# Patient Record
Sex: Female | Born: 1970 | Race: White | Hispanic: No | Marital: Married | Smoking: Current every day smoker
Health system: Southern US, Community
[De-identification: ages and names within clinical notes are randomized; demographics above are authoritative.]

## PROBLEM LIST (undated history)

## (undated) DIAGNOSIS — K760 Fatty (change of) liver, not elsewhere classified: Principal | ICD-10-CM

## (undated) DIAGNOSIS — T7840XA Allergy, unspecified, initial encounter: Secondary | ICD-10-CM

## (undated) DIAGNOSIS — Z720 Tobacco use: Secondary | ICD-10-CM

## (undated) DIAGNOSIS — E785 Hyperlipidemia, unspecified: Secondary | ICD-10-CM

## (undated) DIAGNOSIS — F1910 Other psychoactive substance abuse, uncomplicated: Secondary | ICD-10-CM

## (undated) DIAGNOSIS — R740 Nonspecific elevation of levels of transaminase and lactic acid dehydrogenase [LDH]: Secondary | ICD-10-CM

## (undated) DIAGNOSIS — R7401 Elevation of levels of liver transaminase levels: Secondary | ICD-10-CM

## (undated) DIAGNOSIS — G43909 Migraine, unspecified, not intractable, without status migrainosus: Secondary | ICD-10-CM

## (undated) DIAGNOSIS — K859 Acute pancreatitis without necrosis or infection, unspecified: Secondary | ICD-10-CM

## (undated) HISTORY — PX: CHOLECYSTECTOMY: SHX55

## (undated) HISTORY — PX: BACK SURGERY: SHX140

## (undated) HISTORY — DX: Fatty (change of) liver, not elsewhere classified: K76.0

## (undated) HISTORY — DX: Elevation of levels of liver transaminase levels: R74.01

## (undated) HISTORY — DX: Hypomagnesemia: E83.42

## (undated) HISTORY — DX: Hyperlipidemia, unspecified: E78.5

## (undated) HISTORY — DX: Acute pancreatitis without necrosis or infection, unspecified: K85.90

## (undated) HISTORY — DX: Migraine, unspecified, not intractable, without status migrainosus: G43.909

## (undated) HISTORY — DX: Allergy, unspecified, initial encounter: T78.40XA

## (undated) HISTORY — PX: TONSILLECTOMY: SUR1361

## (undated) HISTORY — DX: Tobacco use: Z72.0

## (undated) HISTORY — PX: ABDOMINAL HYSTERECTOMY: SHX81

## (undated) HISTORY — DX: Nonspecific elevation of levels of transaminase and lactic acid dehydrogenase (ldh): R74.0

## (undated) HISTORY — DX: Other psychoactive substance abuse, uncomplicated: F19.10

---

## 2006-04-20 LAB — CONVERTED CEMR LAB

## 2006-11-06 ENCOUNTER — Ambulatory Visit: Payer: Self-pay | Admitting: Family Medicine

## 2006-11-26 ENCOUNTER — Encounter: Payer: Self-pay | Admitting: Family Medicine

## 2007-01-07 ENCOUNTER — Encounter: Payer: Self-pay | Admitting: Family Medicine

## 2007-03-24 ENCOUNTER — Ambulatory Visit: Payer: Self-pay | Admitting: Family Medicine

## 2008-02-25 ENCOUNTER — Ambulatory Visit: Payer: Self-pay | Admitting: Family Medicine

## 2010-06-16 IMAGING — CR DG FOOT COMPLETE 3+V*R*
3 series · 3 of 3 positions shown · non-contrast
Comparison: None

CLINICAL DATA: Dropped object on foot with pain

RIGHT FOOT COMPLETE - 3+ VIEW

[view not recorded (1 of 3)]
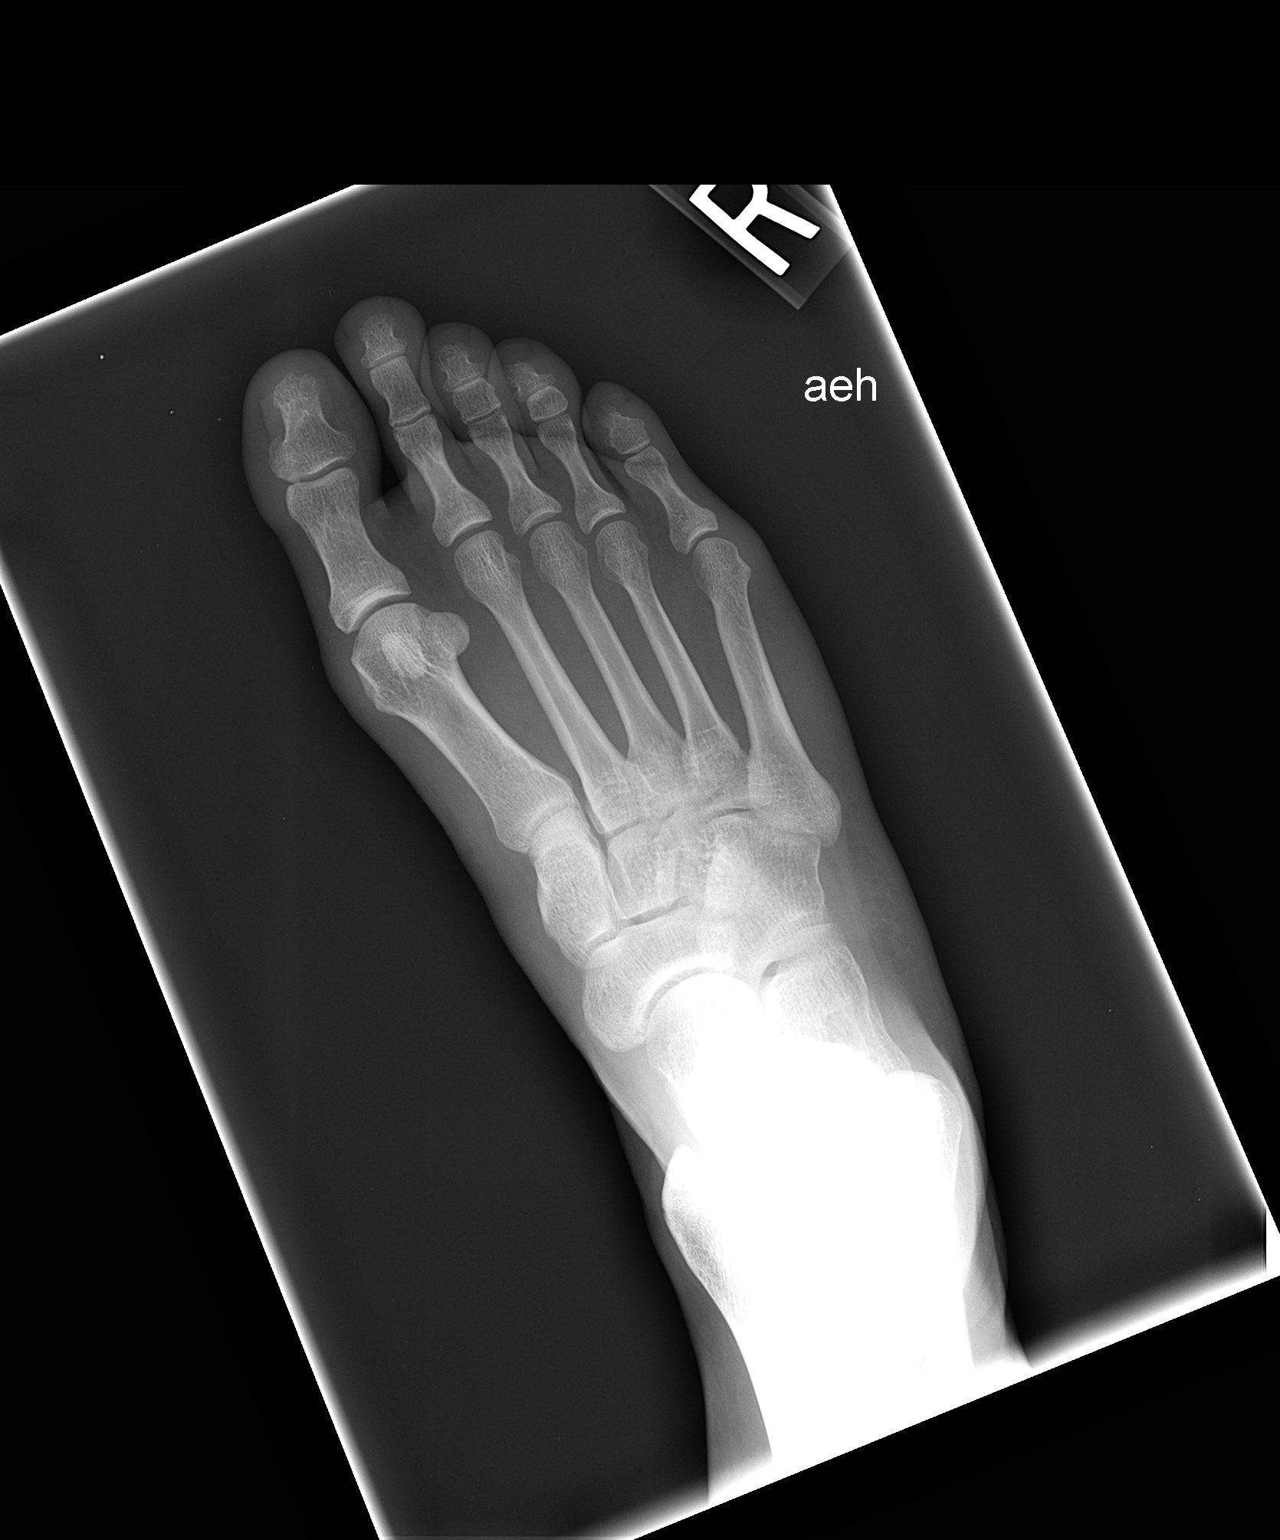

[view not recorded (2 of 3)]
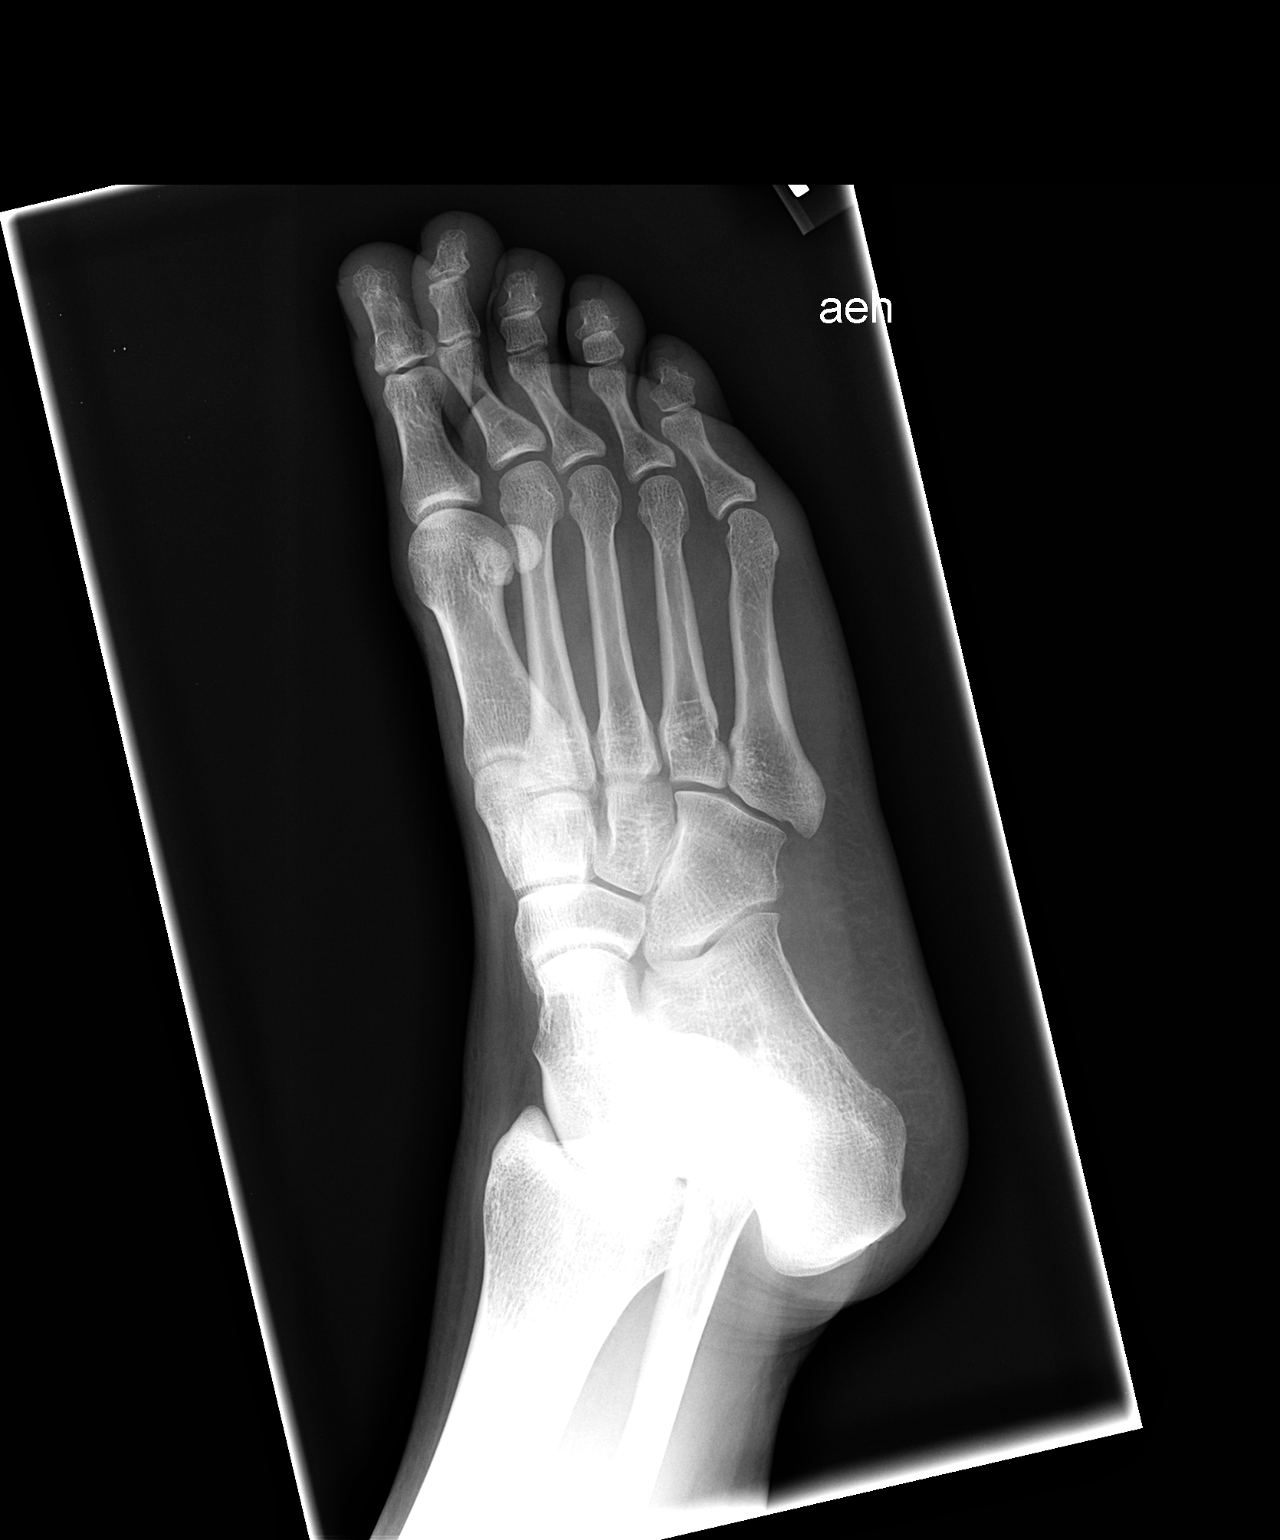

[view not recorded (3 of 3)]
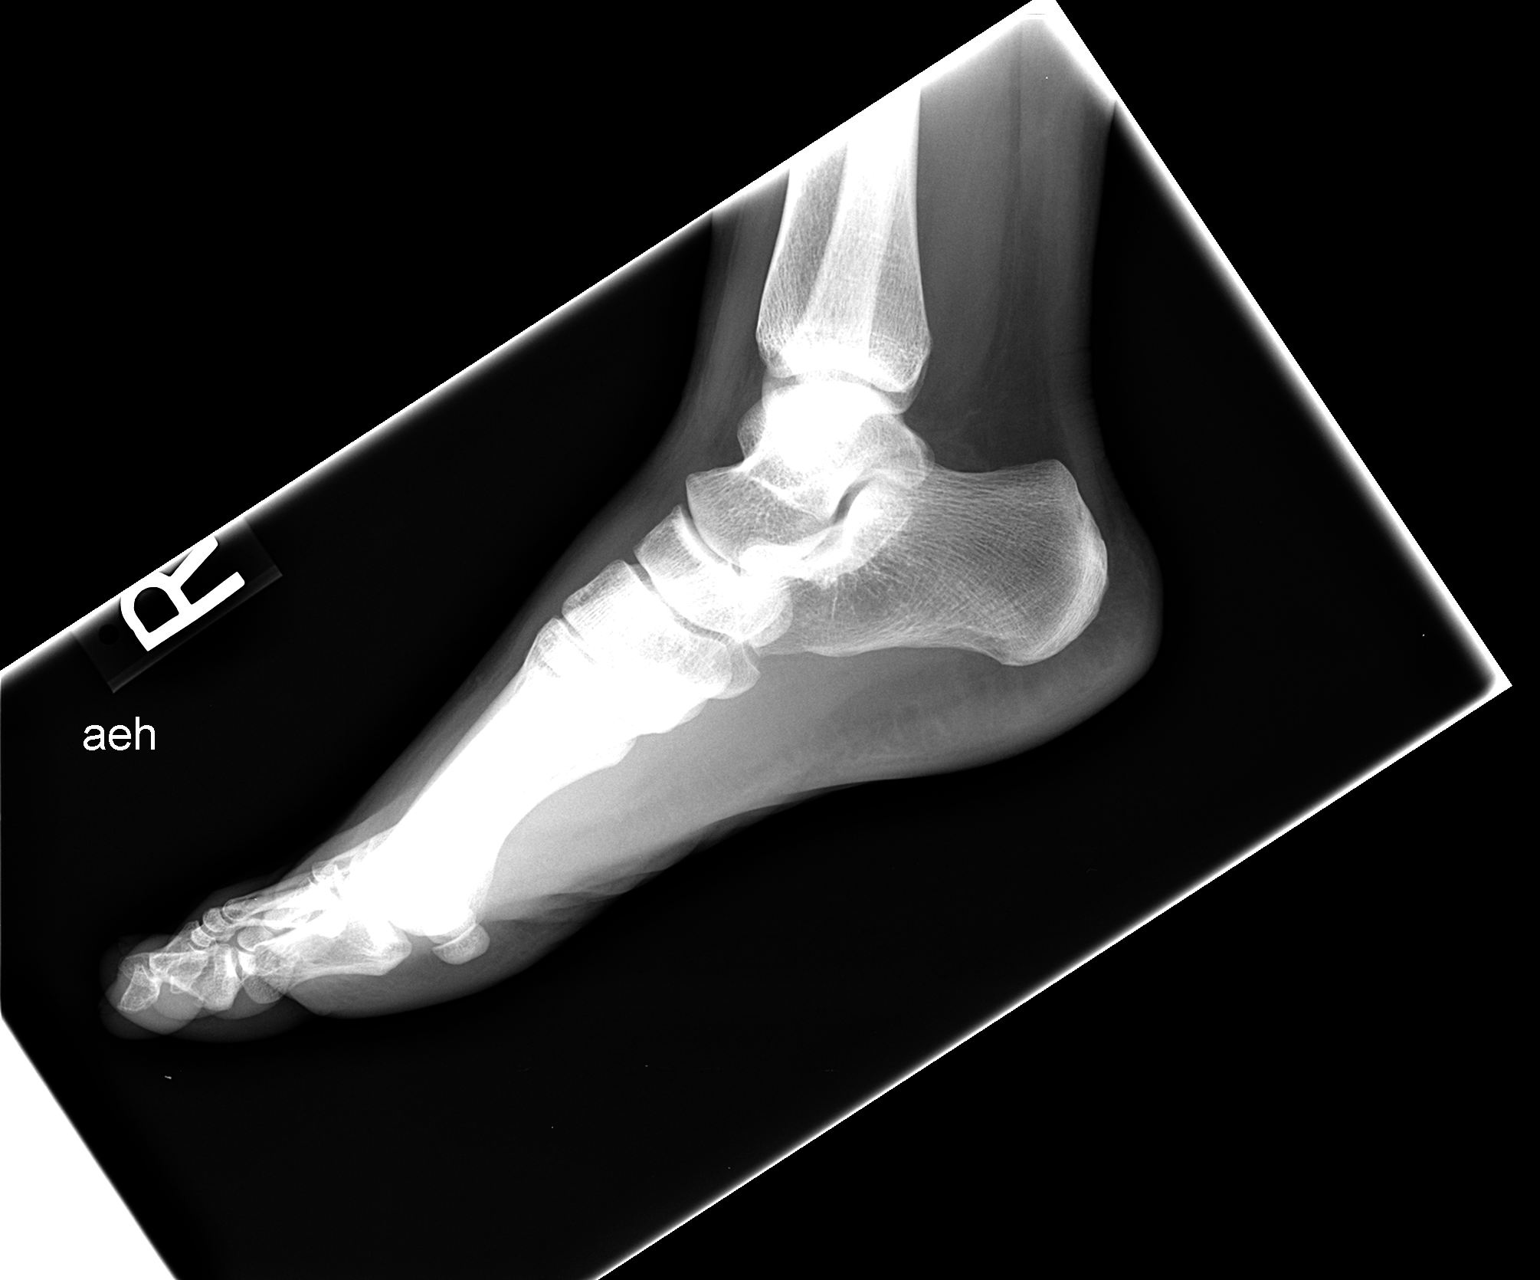

[3 of 3 positions shown; findings below may reference images not displayed]

FINDINGS: No acute fracture is seen.  Tarsal - metatarsal alignment
is normal.  No significant soft tissue swelling is noted.
IMPRESSION: No acute bony abnormality.

## 2013-11-29 LAB — HEPATIC FUNCTION PANEL
ALT: 45 U/L — AB (ref 7–35)
AST: 69 U/L — AB (ref 13–35)

## 2013-11-30 LAB — LIPID PANEL
CHOLESTEROL: 236 mg/dL — AB (ref 0–200)
HDL: 57 mg/dL (ref 35–70)
LDL CALC: 132 mg/dL
Triglycerides: 234 mg/dL — AB (ref 40–160)

## 2013-12-01 LAB — CBC AND DIFFERENTIAL: WBC: 8.1 10^3/mL

## 2013-12-02 LAB — HEPATIC FUNCTION PANEL
ALK PHOS: 91 U/L (ref 25–125)
ALT: 36 U/L — AB (ref 7–35)
AST: 88 U/L — AB (ref 13–35)
Bilirubin, Total: 1.1 mg/dL

## 2013-12-02 LAB — BASIC METABOLIC PANEL
CREATININE: 0.3 mg/dL — AB (ref 0.5–1.1)
POTASSIUM: 3.2 mmol/L — AB (ref 3.4–5.3)

## 2013-12-02 LAB — CBC AND DIFFERENTIAL: WBC: 9.7 10^3/mL

## 2013-12-07 ENCOUNTER — Telehealth: Payer: Self-pay

## 2013-12-07 DIAGNOSIS — K859 Acute pancreatitis, unspecified: Secondary | ICD-10-CM

## 2013-12-07 DIAGNOSIS — E860 Dehydration: Secondary | ICD-10-CM

## 2013-12-07 LAB — LIPASE: LIPASE: 221 U/L — AB (ref 0–75)

## 2013-12-07 NOTE — Telephone Encounter (Signed)
Stacy, Can you please see what hospital she was seen at and whether or not we can get records from her stay prior to her appt tomorrow?

## 2013-12-07 NOTE — Telephone Encounter (Signed)
Rocky LinkKen called this am and stated that Dana Allen was in the hospital in [place] last week and was Dx with dehydration and acute pancreatitis. He also stated that the doctor there stated that Gertude needed to be seen Today or Tuesday of this week or they were not going to release her to go home. The pt. Is eating small amounts, drinking well. As i was on the phone with the husband the pt started to vomit. The husband stated that this was the first time she has vomited sine was in the hospital. Pt is going downstairs to lab to have a CMP and Lipase done today per and I ok it with Dr. Ivan AnchorsHommel for the pt to come in for the labs and to see him tomorrow./Regnald Bowens,CMA

## 2013-12-07 NOTE — Telephone Encounter (Signed)
The husband Rocky LinkKen has the records and labs that where done one a disk and he stated that he was going to print them off and drop them off by the office today./Reata Petrov,CMA

## 2013-12-08 ENCOUNTER — Telehealth: Payer: Self-pay

## 2013-12-08 ENCOUNTER — Ambulatory Visit (INDEPENDENT_AMBULATORY_CARE_PROVIDER_SITE_OTHER): Payer: BC Managed Care – PPO | Admitting: Family Medicine

## 2013-12-08 ENCOUNTER — Encounter: Payer: Self-pay | Admitting: Family Medicine

## 2013-12-08 VITALS — BP 130/84 | HR 72 | Temp 98.5°F | Wt 160.0 lb

## 2013-12-08 DIAGNOSIS — K838 Other specified diseases of biliary tract: Secondary | ICD-10-CM

## 2013-12-08 DIAGNOSIS — E876 Hypokalemia: Secondary | ICD-10-CM

## 2013-12-08 DIAGNOSIS — E785 Hyperlipidemia, unspecified: Secondary | ICD-10-CM | POA: Diagnosis not present

## 2013-12-08 DIAGNOSIS — K7689 Other specified diseases of liver: Secondary | ICD-10-CM

## 2013-12-08 DIAGNOSIS — K859 Acute pancreatitis without necrosis or infection, unspecified: Secondary | ICD-10-CM | POA: Diagnosis not present

## 2013-12-08 DIAGNOSIS — B3731 Acute candidiasis of vulva and vagina: Secondary | ICD-10-CM

## 2013-12-08 DIAGNOSIS — Z8719 Personal history of other diseases of the digestive system: Secondary | ICD-10-CM | POA: Insufficient documentation

## 2013-12-08 DIAGNOSIS — K760 Fatty (change of) liver, not elsewhere classified: Secondary | ICD-10-CM

## 2013-12-08 DIAGNOSIS — K852 Alcohol induced acute pancreatitis without necrosis or infection: Secondary | ICD-10-CM

## 2013-12-08 DIAGNOSIS — B373 Candidiasis of vulva and vagina: Secondary | ICD-10-CM

## 2013-12-08 DIAGNOSIS — R1011 Right upper quadrant pain: Secondary | ICD-10-CM

## 2013-12-08 HISTORY — DX: Hyperlipidemia, unspecified: E78.5

## 2013-12-08 HISTORY — DX: Fatty (change of) liver, not elsewhere classified: K76.0

## 2013-12-08 MED ORDER — OXYCODONE HCL 5 MG PO TABS: 5.0000 mg | ORAL_TABLET | Freq: Four times a day (QID) | ORAL | Status: DC | PRN

## 2013-12-08 MED ORDER — FLUCONAZOLE 150 MG PO TABS: ORAL_TABLET | ORAL | Status: AC

## 2013-12-08 MED ORDER — ONDANSETRON 8 MG PO TBDP: 8.0000 mg | ORAL_TABLET | Freq: Three times a day (TID) | ORAL | Status: DC | PRN

## 2013-12-08 NOTE — Telephone Encounter (Signed)
Dana Allen called and stated that she forgot to tell you when she was in today for her appointment that she has a yeast infection from all the antibiotics from here she was in the hospital and would like to see if she can get an rx for Diflucan sent into [facility]./Alamin Mccuiston,CMA

## 2013-12-08 NOTE — Telephone Encounter (Signed)
Pt informed./Dawt Reeb,CMA 

## 2013-12-08 NOTE — Telephone Encounter (Signed)
Rx has been sent to [facility]. 

## 2013-12-08 NOTE — Progress Notes (Signed)
CC: Dana Allen is a 43 y.o. female is here for Pancreatitis   Subjective: HPI:  Pleasant 43 year old here to establish care  Patient presents for hospital followup 48 admission from July 12 through the 15th [facility] in [place]. She was seen for pancreatitis. She tells me that for the week prior to her admission she was having mild to moderate abdominal pain epigastric region that was nonradiating along with nausea that was progressively getting worse on daily basis. Symptoms became severe in severity on the 12th she was seen in emergency room and diagnosed with pancreatitis with a lipase over 5000. Labs were also significant for a potassium of of 4.2 initially however this declined to 2.9 on her second day of admission and after oral and IV replacement returned to 3.2 on day of discharge. Magnesium was also mildly low during her admission and I believe she had IV supplementation of magnesium. Imaging during her stay was significant for a CT scan that was suggestive of biliary sludge, no evidence of gallstones, and a heterogeneous liver echotexture with areas of increased echogenicity that was concerning for malignancy. Prior to departure she had a liver biopsy which showed severe steatosis and mild fibrosis. Triglycerides were 234, calcium was never elevated, she admits that she was heavily drinking just prior and as symptoms were initially present as she was on vacation.  Since her admission she has not consumed any alcohol nor has she had any trouble abstaining from alcohol.  Since returning home last week she states she feels remarkably better but continues to have moderate nausea after certain foods and persistent 2/10 epigastric pain. She was advancing her diet over the weekend and began to eat spaghetti and casseroles, the above symptoms started to get worse the day after she ate these foods and yesterday she threw up twice however today has had no difficulty keeping  fluids down or foods down. Symptoms are persistent throughout the day, nothing particularly makes them better or worse other than food.  Her only other complaint today is a headache that comes and goes without warning lasting minutes described as constant and like an "axe" straight through her head. She denies any recent or remote motor or sensory disturbances during before or following the headaches   Review of Systems - General ROS: negative for - chills, fever, night sweats, weight gain or weight loss Ophthalmic ROS: negative for - decreased vision Psychological ROS: negative for - anxiety or depression ENT ROS: negative for - hearing change, nasal congestion, tinnitus or allergies Hematological and Lymphatic ROS: negative for - bleeding problems, bruising or swollen lymph nodes Breast ROS: negative Respiratory ROS: no cough, shortness of breath, or wheezing Cardiovascular ROS: no chest pain or dyspnea on exertion Gastrointestinal ROS: no change in bowel habits, or black or bloody stools Genito-Urinary ROS: negative for - genital discharge, genital ulcers, incontinence or abnormal bleeding from genitals Musculoskeletal ROS: negative for - joint pain or muscle pain Neurological ROS: negative for -  memory loss Dermatological ROS: negative for lumps, mole changes, rash and skin lesion changes  Past Medical History  Diagnosis Date  . 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 Hyperlipidemia 12/08/2013    No past surgical history on file. No family history on file.  History   Social History  . Marital Status: Married    Spouse Name: N/A    Number of Children: N/A  .  Years of Education: N/A   Occupational History  . Not on file.   Social History Main Topics  . Smoking status: Not on file  . Smokeless tobacco: Not on file  . Alcohol Use: Not on file  . Drug Use: Not on file  . Sexual Activity: Not on file   Other Topics  Concern  . Not on file   Social History Narrative  . No narrative on file     Objective: BP 130/84  Pulse 72  Temp(Src) 98.5 F (36.9 C) (Oral)  Wt 160 lb (72.576 kg)  General: Alert and Oriented, No Acute Distress HEENT: Pupils equal, round, reactive to light. Conjunctivae clear.  Moist membranes pharynx unremarkable Cranial nerves II through XII grossly intact Lungs: Clear to auscultation bilaterally, no wheezing/ronchi/rales.  Comfortable work of breathing. Good air movement. Cardiac: Regular rate and rhythm. Normal S1/S2.  No murmurs, rubs, nor gallops.   Abdomen: Soft with slight epigastric discomfort., No guarding or rebound Extremities: No peripheral edema.  Strong peripheral pulses.  Mental Status: No depression, anxiety, nor agitation. Skin: Warm and dry.  Assessment & Plan: Lorretta was seen today for pancreatitis.  Diagnoses and associated orders for this visit:  Hepatic steatosis - COMPLETE METABOLIC PANEL WITH GFR  Alcohol-induced acute pancreatitis - COMPLETE METABOLIC PANEL WITH GFR - Ambulatory referral to General Surgery - oxyCODONE (OXY IR/ROXICODONE) 5 MG immediate release tablet; Take 1 tablet (5 mg total) by mouth every 6 (six) hours as needed for moderate pain.  Hypokalemia - COMPLETE METABOLIC PANEL WITH GFR  Hyperlipidemia  Hypomagnesemia - Magnesium  RUQ pain - Ambulatory referral to General Surgery  Biliary sludge - Ambulatory referral to General Surgery  Other Orders - ondansetron (ZOFRAN ODT) 8 MG disintegrating tablet; Take 1 tablet (8 mg total) by mouth every 8 (eight) hours as needed for nausea or vomiting.    Pancreatitis: I believe her symptoms worsen over the weekend due to her advancing her diet a little too soon. I encouraged her to stick to a liquid diet for the rest of the work week and then slowly add bananas, rice, applesauce, toast.  I encouraged her to also schedule the dosing of her Zofran and used to dissolve in  formulation if needed. 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 Overall it sounds like she is improving. Hepatic steatosis: Discussed that this can lead to liver failure, I strongly encouraged her to completely abstain from alcohol for the rest of her life and she expresses agreement and understanding. Hypokalemia: Rechecking potassium Hypomagnesemia: Checking magnesium to see if this is contributing to her headaches Biliary sludge in the setting of pancreatitis: I discussed with her that although I think that her pancreatitis episode was mostly due to alcohol consumption there is a chance that biliary sludge could have contributed to this as well, because of this we will refer her to a general surgeon to discuss if there is any benefit to having her gallbladder removed.  45 minutes spent face-to-face during visit today of which at least 50% was counseling or coordinating care regarding: 1. Hepatic steatosis   2. Alcohol-induced acute pancreatitis   3. Hypokalemia   4. Hyperlipidemia   5. Hypomagnesemia   6. RUQ pain   7. Biliary sludge        Return in about 3 months (around 03/10/2014).

## 2013-12-09 LAB — COMPLETE METABOLIC PANEL WITH GFR
ALT: 15 U/L (ref 0–35)
AST: 21 U/L (ref 0–37)
Albumin: 3.5 g/dL (ref 3.5–5.2)
Alkaline Phosphatase: 74 U/L (ref 39–117)
BILIRUBIN TOTAL: 0.3 mg/dL (ref 0.2–1.2)
BUN: 6 mg/dL (ref 6–23)
CALCIUM: 9.6 mg/dL (ref 8.4–10.5)
CHLORIDE: 101 meq/L (ref 96–112)
CO2: 26 meq/L (ref 19–32)
Creat: 0.56 mg/dL (ref 0.50–1.10)
GFR, Est Non African American: >89 mL/min
GLUCOSE: 75 mg/dL (ref 70–99)
POTASSIUM: 4.3 meq/L (ref 3.5–5.3)
SODIUM: 139 meq/L (ref 135–145)
Total Protein: 6 g/dL (ref 6.0–8.3)

## 2013-12-09 LAB — MAGNESIUM: Magnesium: 1.8 mg/dL (ref 1.5–2.5)

## 2013-12-10 LAB — LIPASE
LIPASE: 3698 U/L — AB (ref 0–53)
Lipase: 5482 units/L — AB (ref 0–53)

## 2013-12-10 LAB — MAGNESIUM
Lipase: 2229 units/L — AB (ref 0–53)
MAGNESIUM: 1.5 mg/dL — AB (ref 1.6–2.4)

## 2013-12-11 ENCOUNTER — Ambulatory Visit: Payer: Self-pay | Admitting: Family Medicine

## 2013-12-30 ENCOUNTER — Other Ambulatory Visit: Payer: Self-pay | Admitting: Family Medicine

## 2014-01-27 ENCOUNTER — Ambulatory Visit: Payer: BC Managed Care – PPO | Admitting: Family Medicine

## 2014-02-01 ENCOUNTER — Ambulatory Visit (INDEPENDENT_AMBULATORY_CARE_PROVIDER_SITE_OTHER): Payer: BC Managed Care – PPO | Admitting: Family Medicine

## 2014-02-01 ENCOUNTER — Encounter: Payer: Self-pay | Admitting: Family Medicine

## 2014-02-01 VITALS — BP 152/100 | HR 76 | Wt 154.0 lb

## 2014-02-01 DIAGNOSIS — R252 Cramp and spasm: Secondary | ICD-10-CM | POA: Diagnosis not present

## 2014-02-01 DIAGNOSIS — Z23 Encounter for immunization: Secondary | ICD-10-CM

## 2014-02-01 DIAGNOSIS — G43801 Other migraine, not intractable, with status migrainosus: Secondary | ICD-10-CM

## 2014-02-01 MED ORDER — ONDANSETRON 8 MG PO TBDP
ORAL_TABLET | ORAL | Status: DC
Start: 2014-02-01 — End: 2016-08-02

## 2014-02-01 NOTE — Progress Notes (Signed)
CC: Dana Allen is a 43 y.o. female is here for f/u after hernia surgery   Subjective: HPI:  Complains of a headache that occurs less than most days of the week that has been present for the past 2 months that began after a severe bout of increased. Headaches have not been getting better or worse since onset. They're described as a pounding sensation in the left frontal region of her head, accompanied by photosensitivity and nausea. Symptoms are greatly reduced with taking Zofran. No benefit from oxycodone. She's not tried anything else yet. She denies any change in the severity character or frequency of headaches over the past month. She denies any known triggers. Denies any other motor sensory disturbances other than that described above and cramping below. She describes symptoms as moderate in severity. Denies fevers, chills, hearing loss, dizziness, memory loss, chest pain or shortness of breath  Complains of cramping in her lower extremities that occurs only at rest after long and tiring days when she's on her feet. Symptoms last only a few minutes. Improves with stretching. No other interventions as of yet.  She denies limb claudication or pain with elevated extremities. She denies any other weakness nor joint pain.   Review Of Systems Outlined In HPI  Past Medical History  Diagnosis Date  . 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 Hyperlipidemia 12/08/2013    No past surgical history on file. No family history on file.  History   Social History  . Marital Status: Married    Spouse Name: N/A    Number of Children: N/A  . Years of Education: N/A   Occupational History  . Not on file.   Social History Main Topics  . Smoking status: Never Smoker   . Smokeless tobacco: Not on file  . Alcohol Use: Not on file  . Drug Use: Not on file  . Sexual Activity: Not on file   Other Topics Concern  . Not on file   Social History  Narrative  . No narrative on file     Objective: BP 152/100  Pulse 76  Wt 154 lb (69.854 kg)  General: Alert and Oriented, No Acute Distress HEENT: Pupils equal, round, reactive to light. Conjunctivae clear.  External ears unremarkable, canals clear with intact TMs with appropriate landmarks.  Middle ear appears open without effusion. Pink inferior turbinates.  Moist mucous membranes, pharynx without inflammation nor lesions.  Neck supple without palpable lymphadenopathy nor abnormal masses. Lungs: Clear to auscultation bilaterally, no wheezing/ronchi/rales.  Comfortable work of breathing. Good air movement. Cardiac: Regular rate and rhythm. Normal S1/S2.  No murmurs, rubs, nor gallops.   Neuro: CN II-XII grossly intact, full strength/rom of all four extremities,  gait normal,  Extremities: No peripheral edema.  Strong peripheral pulses.  Mental Status: No depression, anxiety, nor agitation. Skin: Warm and dry.  Assessment & Plan: Dana Allen was seen today for f/u after hernia surgery.  Diagnoses and associated orders for this visit:  Muscle cramp - COMPLETE METABOLIC PANEL WITH GFR  Other migraine with status migrainosus, not intractable - ondansetron (ZOFRAN-ODT) 8 MG disintegrating tablet; TAKE ONE TABLET EVERY 8 HOURS AS NEEDED FOR NAUSEA or headache    Muscle cramp: Checking calcium, potassium, sodium if normal will try over-the-counter magnesium and scheduled stretching. Migraine: Discussed benign nature of her headaches, unless future change no strong indication for neuroimaging at this time. I offered preventative medication however she  prefers to just stick with Zofran, I've encouraged her not to use oxycodone for a treatment of her headache.   Return if symptoms worsen or fail to improve.

## 2014-02-02 ENCOUNTER — Other Ambulatory Visit: Payer: Self-pay | Admitting: Family Medicine

## 2014-02-02 LAB — COMPLETE METABOLIC PANEL WITH GFR
ALT: 37 U/L — ABNORMAL HIGH (ref 0–35)
AST: 56 U/L — ABNORMAL HIGH (ref 0–37)
Albumin: 4.5 g/dL (ref 3.5–5.2)
Alkaline Phosphatase: 89 U/L (ref 39–117)
BUN: 6 mg/dL (ref 6–23)
CALCIUM: 9.6 mg/dL (ref 8.4–10.5)
CO2: 25 meq/L (ref 19–32)
CREATININE: 0.42 mg/dL — AB (ref 0.50–1.10)
Chloride: 100 mEq/L (ref 96–112)
GFR, Est African American: >89 mL/min
GLUCOSE: 80 mg/dL (ref 70–99)
Potassium: 4.1 mEq/L (ref 3.5–5.3)
Sodium: 141 mEq/L (ref 135–145)
Total Bilirubin: 0.6 mg/dL (ref 0.2–1.2)
Total Protein: 7 g/dL (ref 6.0–8.3)

## 2014-02-02 MED ORDER — MAGNESIUM 400 MG PO CAPS: ORAL_CAPSULE | ORAL | Status: DC

## 2014-07-15 ENCOUNTER — Emergency Department
Admission: EM | Admit: 2014-07-15 | Discharge: 2014-07-15 | Disposition: A | Discharge status: Home / Self Care | Payer: BLUE CROSS/BLUE SHIELD | Source: Home / Self Care | Attending: Emergency Medicine | Admitting: Emergency Medicine

## 2014-07-15 ENCOUNTER — Encounter: Payer: Self-pay | Admitting: Emergency Medicine

## 2014-07-15 DIAGNOSIS — J208 Acute bronchitis due to other specified organisms: Secondary | ICD-10-CM

## 2014-07-15 LAB — POCT INFLUENZA A/B
Influenza A, POC: NEGATIVE
Influenza B, POC: NEGATIVE

## 2014-07-15 LAB — POCT RAPID STREP A (OFFICE): RAPID STREP A SCREEN: NEGATIVE

## 2014-07-15 MED ORDER — AZITHROMYCIN 250 MG PO TABS: 250.0000 mg | ORAL_TABLET | Freq: Every day | ORAL | Status: DC

## 2014-07-15 NOTE — ED Notes (Signed)
Sore throat, body aches, ear pain, congestion x 2 days

## 2014-07-15 NOTE — ED Provider Notes (Signed)
CSN: [id number]     Arrival date & time 07/15/14  1428 History   First MD Initiated Contact with Patient 07/15/14 1514     Chief Complaint  Patient presents with  . Sore Throat   (Consider location/radiation/quality/duration/timing/severity/associated sxs/prior Treatment) Patient is a 44 y.o. female presenting with pharyngitis. The history is provided by the patient. No language interpreter was used.  Sore Throat This is a new problem. The problem occurs constantly. The problem has been gradually worsening. Associated symptoms include shortness of breath. Nothing aggravates the symptoms. Nothing relieves the symptoms. She has tried nothing for the symptoms. The treatment provided moderate relief.    Past Medical History  Diagnosis Date  . 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 Hyperlipidemia 12/08/2013   History reviewed. No pertinent past surgical history. No family history on file. History  Substance Use Topics  . Smoking status: Current Every Day Smoker -- 1.00 packs/day for 30 years  . Smokeless tobacco: Not on file  . Alcohol Use: Yes   OB History    No data available     Review of Systems  HENT: Positive for congestion, ear pain and rhinorrhea.   Respiratory: Positive for shortness of breath.   All other systems reviewed and are negative.   Allergies  Bactrim  Home Medications   Prior to Admission medications   Medication Sig Start Date End Date Taking? Authorizing Provider  Magnesium 400 MG CAPS One PO QD with meal. 02/02/14   Sean Hommel, DO  ondansetron (ZOFRAN-ODT) 8 MG disintegrating tablet TAKE ONE TABLET EVERY 8 HOURS AS NEEDED FOR NAUSEA or headache 02/01/14   Laren BoomSean Hommel, DO  oxyCODONE (OXY IR/ROXICODONE) 5 MG immediate release tablet Take 1 tablet (5 mg total) by mouth every 6 (six) hours as needed for moderate pain. 12/08/13   Sean Hommel, DO   BP 115/82 mmHg  Pulse 96  Temp(Src) 98.3 F (36.8 C)  (Oral)  Ht 5\' 2"  (1.575 m)  Wt 150 lb (68.04 kg)  BMI 27.43 kg/m2  SpO2 98% Physical Exam  Constitutional: She is oriented to person, place, and time. She appears well-developed and well-nourished.  HENT:  Head: Normocephalic.  Mouth/Throat: Oropharyngeal exudate present.  Erythema throat, no exudate  Eyes: EOM are normal. Pupils are equal, round, and reactive to light.  Neck: Normal range of motion.  Cardiovascular: Normal rate and normal heart sounds.   Pulmonary/Chest: Effort normal.  Abdominal: Soft. She exhibits no distension.  Musculoskeletal: Normal range of motion.  Neurological: She is alert and oriented to person, place, and time.  Psychiatric: She has a normal mood and affect.  Nursing note and vitals reviewed.   ED Course  Strep negative influenza negative  Procedures (including critical care time) Labs Review Labs Reviewed - No data to display  Imaging Review No results found.   MDM   1. Acute bronchitis due to other specified organisms    zithromax avs See Dr. Ivan Anchorshommel for recheck.      Lonia SkinnerLeslie K HiawasseeSofia, PA-C 07/15/14 [phone]

## 2014-07-15 NOTE — Discharge Instructions (Signed)

## 2014-07-16 ENCOUNTER — Ambulatory Visit: Payer: Self-pay | Admitting: Family Medicine

## 2015-03-03 ENCOUNTER — Other Ambulatory Visit: Payer: Self-pay | Admitting: Family Medicine

## 2015-05-31 ENCOUNTER — Other Ambulatory Visit: Payer: Self-pay | Admitting: Obstetrics and Gynecology

## 2015-05-31 DIAGNOSIS — R928 Other abnormal and inconclusive findings on diagnostic imaging of breast: Secondary | ICD-10-CM

## 2015-06-27 ENCOUNTER — Ambulatory Visit
Admission: RE | Admit: 2015-06-27 | Discharge: 2015-06-27 | Disposition: A | Discharge status: Home / Self Care | Payer: BLUE CROSS/BLUE SHIELD | Source: Ambulatory Visit | Attending: Obstetrics and Gynecology | Admitting: Obstetrics and Gynecology

## 2015-06-27 DIAGNOSIS — R928 Other abnormal and inconclusive findings on diagnostic imaging of breast: Secondary | ICD-10-CM

## 2015-07-15 ENCOUNTER — Encounter: Payer: Self-pay | Admitting: Family Medicine

## 2015-07-15 DIAGNOSIS — I8390 Asymptomatic varicose veins of unspecified lower extremity: Secondary | ICD-10-CM | POA: Insufficient documentation

## 2016-01-16 ENCOUNTER — Other Ambulatory Visit: Payer: Self-pay | Admitting: Obstetrics and Gynecology

## 2016-01-16 DIAGNOSIS — N6310 Unspecified lump in the right breast, unspecified quadrant: Secondary | ICD-10-CM

## 2016-01-16 DIAGNOSIS — N644 Mastodynia: Secondary | ICD-10-CM

## 2016-02-06 ENCOUNTER — Other Ambulatory Visit: Payer: BLUE CROSS/BLUE SHIELD

## 2016-02-13 ENCOUNTER — Ambulatory Visit
Admission: RE | Admit: 2016-02-13 | Discharge: 2016-02-13 | Disposition: A | Discharge status: Home / Self Care | Payer: 59 | Source: Ambulatory Visit | Attending: Obstetrics and Gynecology | Admitting: Obstetrics and Gynecology

## 2016-02-13 DIAGNOSIS — N644 Mastodynia: Secondary | ICD-10-CM

## 2016-02-13 DIAGNOSIS — N6310 Unspecified lump in the right breast, unspecified quadrant: Secondary | ICD-10-CM

## 2016-05-21 HISTORY — DX: Hypomagnesemia: E83.42

## 2016-08-02 ENCOUNTER — Ambulatory Visit (INDEPENDENT_AMBULATORY_CARE_PROVIDER_SITE_OTHER): Payer: BLUE CROSS/BLUE SHIELD | Admitting: Physician Assistant

## 2016-08-02 ENCOUNTER — Encounter: Payer: Self-pay | Admitting: Physician Assistant

## 2016-08-02 ENCOUNTER — Ambulatory Visit (INDEPENDENT_AMBULATORY_CARE_PROVIDER_SITE_OTHER): Payer: BLUE CROSS/BLUE SHIELD

## 2016-08-02 VITALS — BP 156/96 | HR 64 | Temp 98.1°F | Wt 149.0 lb

## 2016-08-02 DIAGNOSIS — J069 Acute upper respiratory infection, unspecified: Secondary | ICD-10-CM

## 2016-08-02 DIAGNOSIS — F1099 Alcohol use, unspecified with unspecified alcohol-induced disorder: Secondary | ICD-10-CM

## 2016-08-02 DIAGNOSIS — IMO0002 Reserved for concepts with insufficient information to code with codable children: Secondary | ICD-10-CM | POA: Insufficient documentation

## 2016-08-02 DIAGNOSIS — R110 Nausea: Secondary | ICD-10-CM

## 2016-08-02 DIAGNOSIS — R05 Cough: Secondary | ICD-10-CM

## 2016-08-02 DIAGNOSIS — R0989 Other specified symptoms and signs involving the circulatory and respiratory systems: Secondary | ICD-10-CM | POA: Diagnosis not present

## 2016-08-02 DIAGNOSIS — Z720 Tobacco use: Secondary | ICD-10-CM | POA: Diagnosis not present

## 2016-08-02 DIAGNOSIS — F1090 Alcohol use, unspecified, uncomplicated: Secondary | ICD-10-CM

## 2016-08-02 LAB — LIPID PANEL W/REFLEX DIRECT LDL
CHOL/HDL RATIO: 2.1 ratio (ref <5.0)
Cholesterol: 238 mg/dL — ABNORMAL HIGH (ref <200)
HDL: 111 mg/dL (ref >50)
LDL-CHOLESTEROL: 108 mg/dL — AB
Non-HDL Cholesterol (Calc): 127 mg/dL (ref <130)
TRIGLYCERIDES: 93 mg/dL (ref <150)

## 2016-08-02 LAB — COMPLETE METABOLIC PANEL WITH GFR
ALT: 33 U/L — AB (ref 6–29)
AST: 87 U/L — AB (ref 10–35)
Albumin: 4.4 g/dL (ref 3.6–5.1)
Alkaline Phosphatase: 128 U/L — ABNORMAL HIGH (ref 33–115)
BUN: 9 mg/dL (ref 7–25)
CALCIUM: 9.8 mg/dL (ref 8.6–10.2)
CHLORIDE: 100 mmol/L (ref 98–110)
CO2: 23 mmol/L (ref 20–31)
CREATININE: 0.54 mg/dL (ref 0.50–1.10)
GFR, Est African American: >89 mL/min (ref >=60)
GFR, Est Non African American: >89 mL/min (ref >=60)
GLUCOSE: 81 mg/dL (ref 65–99)
POTASSIUM: 3.8 mmol/L (ref 3.5–5.3)
SODIUM: 139 mmol/L (ref 135–146)
Total Bilirubin: 0.8 mg/dL (ref 0.2–1.2)
Total Protein: 7.2 g/dL (ref 6.1–8.1)

## 2016-08-02 LAB — CBC
HCT: 42.7 % (ref 35.0–45.0)
Hemoglobin: 14.2 g/dL (ref 11.7–15.5)
MCH: 35.8 pg — ABNORMAL HIGH (ref 27.0–33.0)
MCHC: 33.3 g/dL (ref 32.0–36.0)
MCV: 107.6 fL — ABNORMAL HIGH (ref 80.0–100.0)
MPV: 11.2 fL (ref 7.5–12.5)
PLATELETS: 266 10*3/uL (ref 140–400)
RBC: 3.97 MIL/uL (ref 3.80–5.10)
RDW: 13.4 % (ref 11.0–15.0)
WBC: 11 10*3/uL — AB (ref 3.8–10.8)

## 2016-08-02 MED ORDER — IPRATROPIUM BROMIDE 0.06 % NA SOLN: 1.0000 | Freq: Four times a day (QID) | NASAL | 0 refills | Status: AC | PRN

## 2016-08-02 MED ORDER — ONDANSETRON 4 MG PO TBDP: 4.0000 mg | ORAL_TABLET | Freq: Three times a day (TID) | ORAL | 2 refills | Status: DC | PRN

## 2016-08-02 MED ORDER — AZITHROMYCIN 250 MG PO TABS: ORAL_TABLET | ORAL | 0 refills | Status: DC

## 2016-08-02 NOTE — Patient Instructions (Addendum)
- Go downstairs for chest xray and labs - Take antibiotic as prescribed - Stay well hydrated - drink at least 1 liter of water per day - Use nasal spray up to 4 times daily - Take zofran every 6 hours as needed for nausea (do not exceed 3 doses per day) - Follow-up in 1 week   Upper Respiratory Infection, Adult Most upper respiratory infections (URIs) are a viral infection of the air passages leading to the lungs. A URI affects the nose, throat, and upper air passages. The most common type of URI is nasopharyngitis and is typically referred to as "the common cold." URIs run their course and usually go away on their own. Most of the time, a URI does not require medical attention, but sometimes a bacterial infection in the upper airways can follow a viral infection. This is called a secondary infection. Sinus and middle ear infections are common types of secondary upper respiratory infections. Bacterial pneumonia can also complicate a URI. A URI can worsen asthma and chronic obstructive pulmonary disease (COPD). Sometimes, these complications can require emergency medical care and may be life threatening. What are the causes? Almost all URIs are caused by viruses. A virus is a type of germ and can spread from one person to another. What increases the risk? You may be at risk for a URI if:  You smoke.  You have chronic heart or lung disease.  You have a weakened defense (immune) system.  You are very young or very old.  You have nasal allergies or asthma.  You work in crowded or poorly ventilated areas.  You work in health care facilities or schools. What are the signs or symptoms? Symptoms typically develop 2-3 days after you come in contact with a cold virus. Most viral URIs last 7-10 days. However, viral URIs from the influenza virus (flu virus) can last 14-18 days and are typically more severe. Symptoms may include:  Runny or stuffy (congested) nose.  Sneezing.  Cough.  Sore  throat.  Headache.  Fatigue.  Fever.  Loss of appetite.  Pain in your forehead, behind your eyes, and over your cheekbones (sinus pain).  Muscle aches. How is this diagnosed? Your health care provider may diagnose a URI by:  Physical exam.  Tests to check that your symptoms are not due to another condition such as:  Strep throat.  Sinusitis.  Pneumonia.  Asthma. How is this treated? A URI goes away on its own with time. It cannot be cured with medicines, but medicines may be prescribed or recommended to relieve symptoms. Medicines may help:  Reduce your fever.  Reduce your cough.  Relieve nasal congestion. Follow these instructions at home:  Take medicines only as directed by your health care provider.  Gargle warm saltwater or take cough drops to comfort your throat as directed by your health care provider.  Use a warm mist humidifier or inhale steam from a shower to increase air moisture. This may make it easier to breathe.  Drink enough fluid to keep your urine clear or pale yellow.  Eat soups and other clear broths and maintain good nutrition.  Rest as needed.  Return to work when your temperature has returned to normal or as your health care provider advises. You may need to stay home longer to avoid infecting others. You can also use a face mask and careful hand washing to prevent spread of the virus.  Increase the usage of your inhaler if you have asthma.  Do not use any tobacco products, including cigarettes, chewing tobacco, or electronic cigarettes. If you need help quitting, ask your health care provider. How is this prevented? The best way to protect yourself from getting a cold is to practice good hygiene.  Avoid oral or hand contact with people with cold symptoms.  Wash your hands often if contact occurs. There is no clear evidence that vitamin C, vitamin E, echinacea, or exercise reduces the chance of developing a cold. However, it is always  recommended to get plenty of rest, exercise, and practice good nutrition. Contact a health care provider if:  You are getting worse rather than better.  Your symptoms are not controlled by medicine.  You have chills.  You have worsening shortness of breath.  You have brown or red mucus.  You have yellow or brown nasal discharge.  You have pain in your face, especially when you bend forward.  You have a fever.  You have swollen neck glands.  You have pain while swallowing.  You have white areas in the back of your throat. Get help right away if:  You have severe or persistent:  Headache.  Ear pain.  Sinus pain.  Chest pain.  You have chronic lung disease and any of the following:  Wheezing.  Prolonged cough.  Coughing up blood.  A change in your usual mucus.  You have a stiff neck.  You have changes in your:  Vision.  Hearing.  Thinking.  Mood. This information is not intended to replace advice given to you by your health care provider. Make sure you discuss any questions you have with your health care provider. Document Released: 10/31/2000 Document Revised: 01/08/2016 Document Reviewed: 08/12/2013 Elsevier Interactive Patient Education  2017 [employer].

## 2016-08-02 NOTE — Progress Notes (Signed)
HPI:                                                                [facility] is a 46 y.o. female who presents to [name]: Primary Care Sports Medicine today to establish care  Current Concerns include body aches   Patient with PMH of alcohol abuse, tobacco use, pancreatitis, HLD, hepatitic steatosis reports myalgias and malaise for approximately 2 weeks. Endorses associated chills, n/v/d, congestion, and cough. Symptoms have been waxing and waning. Denies abdominal pain, hematemesis, hematochezia or melena. She has been taking Zofran.   Health Maintenance Health Maintenance  Topic Date Due  . HIV Screening  08/19/1984  . INFLUENZA VACCINE  01/19/2017 (Originally 12/20/2015)  . MAMMOGRAM  02/12/2018  . TETANUS/TDAP  05/21/2018    Past Medical History:  Diagnosis Date  . Hepatic steatosis 12/08/2013   MILD PORTAL AND PERIVENULAR FIBROSIS on July 2015 Biopsy    . Hyperlipidemia 12/08/2013  . Pancreatitis   . Substance abuse    Past Surgical History:  Procedure Laterality Date  . ABDOMINAL HYSTERECTOMY     endometriosis  . BACK SURGERY    . CHOLECYSTECTOMY    . TONSILLECTOMY     Social History  Substance Use Topics  . Smoking status: Current Every Day Smoker    Packs/day: 1.00    Years: 30.00  . Smokeless tobacco: Never Used  . Alcohol use 3.6 oz/week    6 Shots of liquor per week     Comment: daily drinker   family history is not on file.  Review of Systems  Constitutional: Positive for chills and diaphoresis.  HENT: Positive for congestion. Negative for sore throat.   Eyes: Negative.   Respiratory: Positive for cough (nocturnal) and shortness of breath. Negative for wheezing.   Cardiovascular: Negative for chest pain and palpitations.  Gastrointestinal: Positive for diarrhea, nausea and vomiting (no hematemesis). Negative for abdominal pain, blood in stool and heartburn.  Genitourinary: Negative.   Musculoskeletal: Positive for myalgias.   Skin: Negative for rash.  Neurological: Positive for dizziness, weakness and headaches. Negative for tremors and loss of consciousness.     Medications: Current Outpatient Prescriptions  Medication Sig Dispense Refill  . cefdinir (OMNICEF) 300 MG capsule Take 1 capsule (300 mg total) by mouth 2 (two) times daily. 14 capsule 0  . ipratropium (ATROVENT) 0.06 % nasal spray Place 1 spray into both nostrils 4 (four) times daily as needed. 15 mL 0  . magnesium chloride (SLOW-MAG) 64 MG TBEC SR tablet Take 1 tablet (64 mg total) by mouth 2 (two) times daily. 60 tablet 11  . ondansetron (ZOFRAN-ODT) 4 MG disintegrating tablet Take 1 tablet (4 mg total) by mouth 3 (three) times daily as needed for nausea or vomiting. 16 tablet 2   No current facility-administered medications for this visit.    Allergies  Allergen Reactions  . Bactrim [Sulfamethoxazole-Trimethoprim]        Objective:  BP (!) 156/96   Pulse 64   Temp 98.1 F (36.7 C) (Oral)   Wt 149 lb (67.6 kg)   BMI 27.25 kg/m  Gen: well-groomed, cooperative, not ill-appearing, no distress HEENT: normal conjunctiva, TM's clear, oropharynx clear, moist mucus membranes Pulm: Normal work of breathing, clear to auscultation  bilaterally CV: Normal rate, regular rhythm, s1 and s2 distinct, no murmurs, clicks or rubs GI: normal appearance, bowel sounds active, soft, nondistended, nontender, no masses Neuro: alert and oriented x 3, EOM's intact, PERRLA, DTR's intact MSK: strength 5/5 and symmetric in bilateral upper and lower extremities, normal gait, distal pulses intact, no peripheral edema Skin: warm and dry, no rashes or lesions on exposed skin, no cyanosis Psych: normal affect, pleasant mood, normal speech and thought content  Assessment and Plan: 46 y.o. female with  Alcohol use disorder (HCC), Nausea & Vomiting - checking electrolytes and pancreatic enzymes. Benign abdomen on exam today, which is reassuring - symptomatic  management with Zofran - CBC, COMPLETE METABOLIC PANEL WITH GFR, Lipid panel, Hgb A1C - Magnesium - Amylase - Lipase - ondansetron (ZOFRAN-ODT) 4 MG disintegrating tablet; Take 1 tablet (4 mg total) by mouth 3 (three) times daily as needed for nausea or vomiting.  Dispense: 16 tablet; Refill: 2  Acute upper respiratory infection of multiple sites - DG Chest 2 View to assess for infiltrate - Azithromycin to cover empirically for pulmonary and sinus pathogens - ipratropium (ATROVENT) 0.06 % nasal spray; Place 1 spray into both nostrils 4 (four) times daily as needed.  Dispense: 15 mL; Refill: 0     Patient education and anticipatory guidance given Patient agrees with treatment plan Follow-up in 1 week or sooner as needed  Levonne Hubertharley E. Cummings PA-C

## 2016-08-03 ENCOUNTER — Other Ambulatory Visit: Payer: Self-pay | Admitting: Physician Assistant

## 2016-08-03 DIAGNOSIS — R7401 Elevation of levels of liver transaminase levels: Secondary | ICD-10-CM | POA: Insufficient documentation

## 2016-08-03 DIAGNOSIS — R748 Abnormal levels of other serum enzymes: Secondary | ICD-10-CM

## 2016-08-03 DIAGNOSIS — Z789 Other specified health status: Secondary | ICD-10-CM

## 2016-08-03 DIAGNOSIS — F1090 Alcohol use, unspecified, uncomplicated: Secondary | ICD-10-CM

## 2016-08-03 DIAGNOSIS — R740 Nonspecific elevation of levels of transaminase and lactic acid dehydrogenase [LDH]: Secondary | ICD-10-CM

## 2016-08-03 LAB — HEMOGLOBIN A1C
HEMOGLOBIN A1C: 4.7 % (ref <5.7)
Mean Plasma Glucose: 88 mg/dL

## 2016-08-03 LAB — MAGNESIUM: Magnesium: 1.4 mg/dL — ABNORMAL LOW (ref 1.5–2.5)

## 2016-08-03 LAB — AMYLASE: Amylase: 54 U/L (ref 0–105)

## 2016-08-03 LAB — LIPASE: Lipase: 23 U/L (ref 7–60)

## 2016-08-03 MED ORDER — MAGNESIUM CHLORIDE 64 MG PO TBEC: 1.0000 | DELAYED_RELEASE_TABLET | Freq: Two times a day (BID) | ORAL | 11 refills | Status: AC

## 2016-08-03 NOTE — Progress Notes (Signed)
Patient with daily heavy alcohol consumption, found to have elevated alk phos and transaminitis on cmp. Last abdominal ultrasound was in 2015.  Plan to repeat annual abdominal ultrasound to r/o liver mass/cirrhosis and hepatitis B and C screening.  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 FURTHER CHARACTERIZATION WITH CONTRAST   ENHANCED, LIVER PROTOCOL MRI IS RECOMMENDED.   ALL PROVIDED IMAGES WERE REVIEWED.  Addendum Read By- Vilinda Blanks  Addendum Reading Physician- Charleston Poot  Addendum Releasing Physician- Charleston Poot  Addendum Released Date Time- 11/30/13 1042  --------------------------------------------------------------------------- ---   Approved By : Dr. Charleston Poot ---- I have personally reviewed this study and agree with the final  report as presented above ----  Result Narrative   -- FINAL REPORT -- __________________   INDICATION- PANCREATITIS , ABDOMINAL PAIN   COMPARISON- CT OF THE ABDOMEN AND PELVIS DATED 11/29/2013   TECHNIQUE- GRAYSCALE AND COLOR DOPPLER IMAGING OF THE ABDOMEN WITH   SPECTRAL ANALYSIS OF THE MAIN PORTAL VEIN AND THE INTRAHEPATIC VEINS.   FINDINGS-   UNREMARKABLE HEAD AND BODY OF THE PANCREAS. THE PANCREATIC TAIL   CANNOT BE VISUALIZED DUE TO OVERLYING BOWEL GAS.NO PERIPANCREATIC   FLUID COLLECTIONS.   SCREENING IMAGES DEMONSTRATE NO ABDOMINAL AORTIC ANEURYSM. THE   PROXIMAL, MIDDLE AND DISTAL ABDOMINAL AORTA MEASURE 1.9 CM, 1.8 CM   AND 1.4 CM RESPECTIVELY. NO THROMBUS OR OCCLUSION IS NOTED IN THE   IMAGED PORTIONS OF INFERIOR VENA CAVA.   THE LIVER MEASURES 15.9 CM AND DEMONSTRATES HETEROGENEOUS ECHOTEXTURE   WITH AREAS OF INCREASED ECHOGENICITY. NO INTRAHEPATIC MASSES OR   INTRAHEPATIC BILIARY  DILATATION. NORMAL HEPATOPETAL FLOW IN THE MAIN   PORTAL VEIN AND TRIPHASIC WAVEFORM IN THE HEPATIC VEINS.    NO GALLBLADDER WALL THICKENING, GALLSTONES, BILIARYSLUDGE, POLYPS   ANDPERICHOLECYSTIC FLUID. NEGATIVE SONOGRAPHIC MURPHY'S SIGN.   THE COMMON BILE DUCT MEASURES 0.4 CM.   THE SPLEEN MEASURES 6.8 X 2.9 X 3.3CM. NO FOCAL SPLENIC MASSES.   THE RIGHT KIDNEY MEASURES 11.1 X 5.8 X 6.1 CM AND THE LEFT KIDNEY   MEASURES 10.5 X 6.2 X 5.4CM. NO HYDRONEPHROSIS OR NEPHROLITHIASIS.   NO ABDOMINAL ASCITES.   IMPRESSION-   1. HETEROGENEOUS LIVER ECHOTEXTURE WITH AREAS OF INCREASED   ECHOGENICITY, WHICH MAY REFLECT GEOGRAPHIC HEPATIC STEATOSIS VERSUS   UNDERLYING HEPATOCELLULAR DISEASE. FURTHER EVALUATION WITH PRE-AND   POSTCONTRAST HEPATIC MRI CAN BE OBTAINED IF CLINICALLY WARRANTED.   2. NO GALLSTONES OR EVIDENCE OF ACUTE CHOLECYSTITIS. NO   INTRAHEPATIC/EXTRAHEPATIC BILIARY DUCTAL DILATATION.   ALL PROVIDED IMAGES WERE REVIEWED.   Read ByVilinda Blanks  Reading Physician- Charleston Poot  Releasing Physician- Charleston Poot  Released Date Time- 11/30/13 1039

## 2016-08-04 ENCOUNTER — Telehealth: Payer: Self-pay | Admitting: Family Medicine

## 2016-08-04 MED ORDER — CEFDINIR 300 MG PO CAPS: 300.0000 mg | ORAL_CAPSULE | Freq: Two times a day (BID) | ORAL | 0 refills | Status: DC

## 2016-08-04 NOTE — Telephone Encounter (Signed)
Pt called after hours line noting nausea and vomiting with azithromycin. Plan to switch to omnicef.  Continue zofran.  Return PRN.

## 2016-08-05 ENCOUNTER — Encounter: Payer: Self-pay | Admitting: Physician Assistant

## 2016-08-09 ENCOUNTER — Ambulatory Visit: Payer: Self-pay | Admitting: Physician Assistant

## 2016-08-13 ENCOUNTER — Ambulatory Visit (INDEPENDENT_AMBULATORY_CARE_PROVIDER_SITE_OTHER): Payer: BLUE CROSS/BLUE SHIELD | Admitting: Physician Assistant

## 2016-08-13 VITALS — BP 153/92 | HR 73 | Wt 151.0 lb

## 2016-08-13 DIAGNOSIS — H6981 Other specified disorders of Eustachian tube, right ear: Secondary | ICD-10-CM

## 2016-08-13 DIAGNOSIS — Z7289 Other problems related to lifestyle: Secondary | ICD-10-CM | POA: Diagnosis not present

## 2016-08-13 DIAGNOSIS — R030 Elevated blood-pressure reading, without diagnosis of hypertension: Secondary | ICD-10-CM | POA: Diagnosis not present

## 2016-08-13 DIAGNOSIS — T781XXA Other adverse food reactions, not elsewhere classified, initial encounter: Secondary | ICD-10-CM | POA: Insufficient documentation

## 2016-08-13 DIAGNOSIS — H6991 Unspecified Eustachian tube disorder, right ear: Secondary | ICD-10-CM

## 2016-08-13 DIAGNOSIS — R7401 Elevation of levels of liver transaminase levels: Secondary | ICD-10-CM

## 2016-08-13 DIAGNOSIS — R110 Nausea: Secondary | ICD-10-CM

## 2016-08-13 DIAGNOSIS — R748 Abnormal levels of other serum enzymes: Secondary | ICD-10-CM

## 2016-08-13 DIAGNOSIS — R740 Nonspecific elevation of levels of transaminase and lactic acid dehydrogenase [LDH]: Secondary | ICD-10-CM | POA: Diagnosis not present

## 2016-08-13 DIAGNOSIS — F1090 Alcohol use, unspecified, uncomplicated: Secondary | ICD-10-CM

## 2016-08-13 DIAGNOSIS — Z789 Other specified health status: Secondary | ICD-10-CM

## 2016-08-13 DIAGNOSIS — T781XXS Other adverse food reactions, not elsewhere classified, sequela: Secondary | ICD-10-CM

## 2016-08-13 MED ORDER — FLUTICASONE PROPIONATE 50 MCG/ACT NA SUSP: 2.0000 | Freq: Every day | NASAL | 6 refills | Status: AC

## 2016-08-13 NOTE — Progress Notes (Signed)
HPI:                                                                Dana [facility] is a 46 y.o. female who [facility]: Primary Care Sports Medicine today for 1 week follow-up of labs  Patient with PMH of hepatic steatosis, pancreatitis, transaminitis, and heavy alcohol use presents for 1 week follow-up of acute URI. She experienced nausea and vomiting with Azithromycin and was switched to Cefdinir. Completed antibiotic therapy and is feeling improved, though still has some residual nasal congestion.  She denies fever, chills, n/v/d, abdominal pain. Pancreatic enzymes were wnl. Transaminases remain elevated as well as alkaline phosphatase. She does endorse drinking hard liquor daily, but is not specific about the quantity. Patient had a cholangiogram on 12/2013 during her lap cholesystectomy which was negative. Last abdominal US was 11/30/2013 during hospitalization for acute pancreatitis showed heterogenous liver echotexture with areas of increased echogenicity c/f underlying hepatocellular disease. She underwent a liver bx which showed severe steatosis, mild portal and perivenula fibrosis and focal mild ballooning degeneration of hepatocytes.  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 She said most recently while eating out at [facility] about six months ago she experienced facial swelling and difficulty breathing and had to use her epi-pen. She did not seek medical attention. She is not aware of any known food allergies.   Past Medical History:  Diagnosis Date  . Hepatic steatosis 12/08/2013   bx: severe steatosis, mild portal and perivenula fibrosis and focal mild ballooning degeneration of hepatocytes  . Hyperlipidemia 12/08/2013  . Hypomagnesemia 2018  . Pancreatitis   .  Substance abuse   . Transaminitis    Past Surgical History:  Procedure Laterality Date  . ABDOMINAL HYSTERECTOMY     endometriosis  . BACK SURGERY    . CHOLECYSTECTOMY    . TONSILLECTOMY     Social History  Substance Use Topics  . Smoking status: Current Every Day Smoker    Packs/day: 1.00    Years: 30.00  . Smokeless tobacco: Never Used  . Alcohol use 7.2 oz/week    12 Shots of liquor per week     Comment: daily drinker   family history is not on file.  ROS: negative except as noted in the HPI  Medications: Current Outpatient Prescriptions  Medication Sig Dispense Refill  . fluticasone (FLONASE) 50 MCG/ACT nasal spray Place 2 sprays into both nostrils daily. 16 g 6  . ipratropium (ATROVENT) 0.06 % nasal spray Place 1 spray into both nostrils 4 (four) times daily as needed. 15 mL 0  . magnesium chloride (SLOW-MAG) 64 MG TBEC SR tablet Take 1 tablet (64 mg total) by mouth 2 (two) times daily. 60 tablet 11  . ondansetron (ZOFRAN-ODT) 4 MG disintegrating tablet Take 1 tablet (4 mg total) by mouth 3 (three) times daily as needed for nausea or vomiting. 16 tablet 2   No current facility-administered medications for this visit.    Allergies  Allergen Reactions  . Bee  Venom Anaphylaxis    CARRIES EPI PEN  . Azithromycin Nausea And Vomiting  . Bactrim [Sulfamethoxazole-Trimethoprim] Rash       Objective:  BP (!) 153/92   Pulse 73   Wt 151 lb (68.5 kg)   BMI 27.62 kg/m  Gen: well-groomed, cooperative, not ill-appearing, no distress HEENT: normal conjunctiva, no icterus, right TM dark red and retracted, left TM clear,  oropharynx clear, moist mucus membranes, no frontal or maxillary sinus tenderness Pulm: Normal work of breathing, normal phonation, clear to auscultation bilaterally, no wheezes, rales or rhonchi CV: Normal rate, regular rhythm, s1 and s2 distinct, no murmurs, clicks or rubs  GI: soft, nondistended, nontender Neuro: alert and oriented x 3, EOM's  intact Skin: warm and dry, no rashes or lesions on exposed skin, no cyanosis, no jaundice  Assessment and Plan: 46 y.o. female with   Transaminitis, Elevated alkaline phosphatase level, Chronic nausea - Hepatitis C antibody - US Abdomen Complete - Hepatitis B Surface AntiBODY - Hepatitis B Surface AntiGEN - Gamma GT - Ambulatory referral to Gastroenterology  Heavy alcohol consumption - US Abdomen Complete - recommend she complete this annually to r/o liver lesion - Gamma GT - recommended abstinence and offered referral for medication management - Ambulatory referral to Gastroenterology for possible EGD to screen for esophageal varices  Allergic reaction to food, sequela - Ambulatory referral to Allergy for formal testing - refilled Epi-pen  Dysfunction of right eustachian tube - fluticasone (FLONASE) 50 MCG/ACT nasal spray; Place 2 sprays into both nostrils daily.  Dispense: 16 g; Refill: 6  Elevated Blood Pressure Reading - Check blood pressure at home for the next 2 weeks - Check around the same time each day in a relaxed setting (rest for at least 3 minutes, legs uncrossed, arm above level or your heart) - Limit salt. Follow DASH eating plan - Follow-up in 2-3 weeks  Patient education and anticipatory guidance given Patient agrees with treatment plan Follow-up in 2 weeks for BP check or sooner as needed  Levonne Hubert PA-C

## 2016-08-13 NOTE — Patient Instructions (Addendum)
Schedule your abdominal ultrasound  Flonase - 2 sprays in each nostril daily for your right ear and sinuses Follow-up if no improvement in 1 week  For your blood pressure: - Check blood pressure at home for the next 2 weeks - Check around the same time each day in a relaxed setting (rest for at least 3 minutes, legs uncrossed, arm above level or your heart) - Limit salt. Follow DASH eating plan - Follow-up in 2-3 weeks    How to Take Your Blood Pressure Blood pressure is a measurement of how strongly your blood is pressing against the walls of your arteries. Arteries are blood vessels that carry blood from your heart throughout your body. Your health care provider takes your blood pressure at each office visit. You can also take your own blood pressure at home with a blood pressure machine. You may need to take your own blood pressure:  To confirm a diagnosis of high blood pressure (hypertension).  To monitor your blood pressure over time.  To make sure your blood pressure medicine is working. Supplies needed: To take your blood pressure, you will need a blood pressure machine. You can buy a blood pressure machine, or blood pressure monitor, at most drugstores or online. There are several types of home blood pressure monitors. When choosing one, consider the following:  Choose a monitor that has an arm cuff.  Choose a monitor that wraps snugly around your upper arm. You should be able to fit only one finger between your arm and the cuff.  Do not choose a monitor that measures your blood pressure from your wrist or finger. Your health care provider can suggest a reliable monitor that will meet your needs. How to prepare To get the most accurate reading, avoid the following for 30 minutes before you check your blood pressure:  Drinking caffeine.  Drinking alcohol.  Eating.  Smoking.  Exercising. Five minutes before you check your blood pressure:  Empty your bladder.  Sit  quietly without talking in a dining chair, rather than in a soft couch or armchair. How to take your blood pressure To check your blood pressure, follow the instructions in the manual that came with your blood pressure monitor. If you have a digital blood pressure monitor, the instructions may be as follows: 1. Sit up straight. 2. Place your feet on the floor. Do not cross your ankles or legs. 3. Rest your left arm at the level of your heart on a table or desk or on the arm of a chair. 4. Pull up your shirt sleeve. 5. Wrap the blood pressure cuff around the upper part of your left arm, 1 inch (2.5 cm) above your elbow. It is best to wrap the cuff around bare skin. 6. Fit the cuff snugly around your arm. You should be able to place only one finger between the cuff and your arm. 7. Position the cord inside the groove of your elbow. 8. Press the power button. 9. Sit quietly while the cuff inflates and deflates. 10. Read the digital reading on the monitor screen and write it down (record it). 11. Wait 2-3 minutes, then repeat the steps, starting at step 1. What does my blood pressure reading mean? A blood pressure reading consists of a higher number over a lower number. Ideally, your blood pressure should be below 120/80. The first ("top") number is called the systolic pressure. It is a measure of the pressure in your arteries as your heart beats. The second ("  bottom") number is called the diastolic pressure. It is a measure of the pressure in your arteries as the heart relaxes. Blood pressure is classified into four stages. The following are the stages for adults who do not have a short-term serious illness or a chronic condition. Systolic pressure and diastolic pressure are measured in a unit called mm Hg. Normal   Systolic pressure: below 120.  Diastolic pressure: below 80. Elevated   Systolic pressure: 120-129.  Diastolic pressure: below 80. Hypertension stage 1   Systolic pressure:  130-139.  Diastolic pressure: 80-89. Hypertension stage 2   Systolic pressure: 140 or above.  Diastolic pressure: 90 or above. You can have prehypertension or hypertension even if only the systolic or only the diastolic number in your reading is higher than normal. Follow these instructions at home:  Check your blood pressure as often as recommended by your health care provider.  Take your monitor to the next appointment with your health care provider to make sure:  That you are using it correctly.  That it provides accurate readings.  Be sure you understand what your goal blood pressure numbers are.  Tell your health care provider if you are having any side effects from blood pressure medicine. Contact a health care provider if:  Your blood pressure is consistently high. Get help right away if:  Your systolic blood pressure is higher than 180.  Your diastolic blood pressure is higher than 110. This information is not intended to replace advice given to you by your health care provider. Make sure you discuss any questions you have with your health care provider. Document Released: 10/14/2015 Document Revised: 12/27/2015 Document Reviewed: 10/14/2015 Elsevier Interactive Patient Education  2017 [employer].

## 2016-08-14 LAB — HEPATITIS C ANTIBODY: HCV AB: NEGATIVE

## 2016-08-14 LAB — HEPATITIS B SURFACE ANTIGEN: Hepatitis B Surface Ag: NEGATIVE

## 2016-08-14 LAB — GAMMA GT: GGT: 529 U/L — AB (ref 7–51)

## 2016-08-14 LAB — HEPATITIS B SURFACE ANTIBODY,QUALITATIVE: Hep B S Ab: NEGATIVE

## 2016-08-15 ENCOUNTER — Encounter: Payer: Self-pay | Admitting: Physician Assistant

## 2016-08-15 DIAGNOSIS — Z789 Other specified health status: Secondary | ICD-10-CM | POA: Insufficient documentation

## 2016-08-15 DIAGNOSIS — F1090 Alcohol use, unspecified, uncomplicated: Secondary | ICD-10-CM | POA: Insufficient documentation

## 2016-08-15 DIAGNOSIS — H6991 Unspecified Eustachian tube disorder, right ear: Secondary | ICD-10-CM | POA: Insufficient documentation

## 2016-08-15 DIAGNOSIS — R030 Elevated blood-pressure reading, without diagnosis of hypertension: Secondary | ICD-10-CM | POA: Insufficient documentation

## 2016-08-15 DIAGNOSIS — H6981 Other specified disorders of Eustachian tube, right ear: Secondary | ICD-10-CM | POA: Insufficient documentation

## 2016-08-15 MED ORDER — EPINEPHRINE 0.3 MG/0.3ML IJ SOAJ: 0.3000 mg | Freq: Once | INTRAMUSCULAR | 2 refills | Status: AC | PRN

## 2016-08-27 ENCOUNTER — Telehealth: Payer: Self-pay

## 2016-08-27 NOTE — Telephone Encounter (Signed)
Pt has a yeast infection from antibiotic, can she get a script for Diflucan- it usually takes 2 doses for pt.  Please advise.

## 2016-08-27 NOTE — Telephone Encounter (Signed)
Called in script to [place] at [facility].  Notified -patient.

## 2016-08-27 NOTE — Telephone Encounter (Signed)
Okay to do Diflucan x 1. Since she has elevated liver enzymes, I would prefer she do an intravaginal treatment if she is still having symptoms after one dose.

## 2016-09-03 ENCOUNTER — Ambulatory Visit: Payer: BLUE CROSS/BLUE SHIELD | Admitting: Physician Assistant

## 2016-09-04 DIAGNOSIS — B029 Zoster without complications: Secondary | ICD-10-CM | POA: Diagnosis not present

## 2016-09-10 ENCOUNTER — Ambulatory Visit: Payer: BLUE CROSS/BLUE SHIELD | Admitting: Physician Assistant

## 2016-10-17 ENCOUNTER — Other Ambulatory Visit: Payer: Self-pay | Admitting: Physician Assistant

## 2016-10-17 DIAGNOSIS — R110 Nausea: Secondary | ICD-10-CM

## 2016-12-21 ENCOUNTER — Other Ambulatory Visit: Payer: Self-pay | Admitting: Physician Assistant

## 2016-12-21 DIAGNOSIS — R110 Nausea: Secondary | ICD-10-CM

## 2017-04-22 DIAGNOSIS — M7542 Impingement syndrome of left shoulder: Secondary | ICD-10-CM | POA: Diagnosis not present

## 2017-04-22 DIAGNOSIS — M1812 Unilateral primary osteoarthritis of first carpometacarpal joint, left hand: Secondary | ICD-10-CM | POA: Diagnosis not present

## 2017-04-22 DIAGNOSIS — R52 Pain, unspecified: Secondary | ICD-10-CM | POA: Diagnosis not present

## 2017-04-22 DIAGNOSIS — M7502 Adhesive capsulitis of left shoulder: Secondary | ICD-10-CM | POA: Diagnosis not present

## 2017-04-22 DIAGNOSIS — M1811 Unilateral primary osteoarthritis of first carpometacarpal joint, right hand: Secondary | ICD-10-CM | POA: Diagnosis not present

## 2017-07-04 DIAGNOSIS — L304 Erythema intertrigo: Secondary | ICD-10-CM | POA: Diagnosis not present

## 2017-07-21 ENCOUNTER — Emergency Department (INDEPENDENT_AMBULATORY_CARE_PROVIDER_SITE_OTHER)
Admission: EM | Admit: 2017-07-21 | Discharge: 2017-07-21 | Disposition: A | Discharge status: Home / Self Care | Payer: BLUE CROSS/BLUE SHIELD | Source: Home / Self Care | Attending: Family Medicine | Admitting: Family Medicine

## 2017-07-21 ENCOUNTER — Other Ambulatory Visit: Payer: Self-pay

## 2017-07-21 ENCOUNTER — Encounter: Payer: Self-pay | Admitting: Emergency Medicine

## 2017-07-21 DIAGNOSIS — R69 Illness, unspecified: Secondary | ICD-10-CM | POA: Diagnosis not present

## 2017-07-21 DIAGNOSIS — J111 Influenza due to unidentified influenza virus with other respiratory manifestations: Secondary | ICD-10-CM

## 2017-07-21 MED ORDER — OSELTAMIVIR PHOSPHATE 75 MG PO CAPS: 75.0000 mg | ORAL_CAPSULE | Freq: Two times a day (BID) | ORAL | 0 refills | Status: AC

## 2017-07-21 NOTE — ED Triage Notes (Signed)
Patient reports severe body aches, some cough with pressure in chest, sense of fever, ear pain, all starting yesterday.

## 2017-07-21 NOTE — ED Provider Notes (Signed)
[facility]    CSN: [id number] Arrival date & time: 07/21/17  1112     History   Chief Complaint Chief Complaint  Patient presents with  . Generalized Body Aches  . Otalgia  . Cough  . Chest Pain    HPI Dana Allen is a 47 y.o. female.   Yesterday patient developed myalgias, headache, chills, fatigue, and cough.  Also has mild nasal congestion and sore throat.  Cough is non-productive.  No pleuritic pain or shortness of breath, although she feels tight in her anterior chest.  She has had mild nausea without vomiting.   She has two close friends who have been diagnosed with the flu.   The history is provided by the patient.    Past Medical History:  Diagnosis Date  . Allergy   . Hepatic steatosis 12/08/2013   bx: severe steatosis, mild portal and perivenula fibrosis and focal mild ballooning degeneration of hepatocytes  . Hyperlipidemia 12/08/2013  . Hypomagnesemia 2018  . Migraines   . Pancreatitis   . Substance abuse (HCC)   . Tobacco use   . Transaminitis     Patient Active Problem List   Diagnosis Date Noted  . Elevated blood pressure reading 08/15/2016  . Dysfunction of right eustachian tube 08/15/2016  . Heavy alcohol consumption 08/15/2016  . Chronic nausea 08/13/2016  . Allergic reaction to food 08/13/2016  . Transaminitis 08/03/2016  . Elevated alkaline phosphatase level 08/03/2016  . Tobacco use 08/02/2016  . Alcohol use disorder 08/02/2016  . 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 Other migraine with status migrainosus, not intractable 02/01/2014  . Hepatic steatosis 12/08/2013  . History of pancreatitis 12/08/2013  . Hyperlipidemia 12/08/2013  . Hypomagnesemia 12/08/2013    Past Surgical History:  Procedure Laterality Date  . ABDOMINAL HYSTERECTOMY     endometriosis  . BACK SURGERY    . CHOLECYSTECTOMY    . TONSILLECTOMY      OB History    No data available       Home Medications    Prior to Admission medications     Medication Sig Start Date End Date Taking? Authorizing Provider  EPINEPHrine 0.3 mg/0.3 mL IJ SOAJ injection Inject 0.3 mLs (0.3 mg total) into the muscle once as needed. 08/15/16   Carlis Stableummings, Charley Elizabeth, PA-C  fluticasone [facility](FLONASE) 50 MCG/ACT nasal spray Place 2 sprays into both nostrils daily. 08/13/16   Carlis Stableummings, Charley Elizabeth, PA-C  ipratropium (ATROVENT) 0.06 % nasal spray Place 1 spray into both nostrils 4 (four) times daily as needed. 08/02/16   Carlis Stableummings, Charley Elizabeth, PA-C  magnesium chloride (SLOW-MAG) 64 MG TBEC SR tablet Take 1 tablet (64 mg total) by mouth 2 (two) times daily. 08/03/16   Carlis Stableummings, Charley Elizabeth, PA-C  ondansetron (ZOFRAN-ODT) 4 MG disintegrating tablet Take 1 tablet (4 mg total) by mouth 3 (three) times daily as needed for nausea or vomiting. 10/17/16   Carlis Stableummings, Charley Elizabeth, PA-C  oseltamivir (TAMIFLU) 75 MG capsule Take 1 capsule (75 mg total) by mouth every 12 (twelve) hours. 07/21/17   Lattie HawBeese, Stephen A, MD    Family History History reviewed. No pertinent family history.  Social History Social History   Tobacco Use  . Smoking status: Current Every Day Smoker    Packs/day: 1.00    Years: 30.00    Pack years: 30.00  . Smokeless tobacco: Never Used  Substance Use Topics  . Alcohol use: Yes    Alcohol/week: 7.2 oz    Types: 12  Shots of liquor per week    Comment: daily drinker  . Drug use: No     Allergies   Bee venom; Azithromycin; and Bactrim [sulfamethoxazole-trimethoprim]   Review of Systems Review of Systems + sore throat + cough No pleuritic pain No wheezing + nasal congestion + post-nasal drainage No sinus pain/pressure No itchy/red eyes ? earache No hemoptysis No SOB ? fever, + chills + nausea No vomiting No abdominal pain No diarrhea No urinary symptoms No skin rash + fatigue + myalgias + headache    Physical Exam Triage Vital Signs ED Triage Vitals  Enc Vitals Group     BP 07/21/17 1124 108/75      Pulse Rate 07/21/17 1124 96     Resp 07/21/17 1124 16     Temp 07/21/17 1124 98.4 F (36.9 C)     Temp Source 07/21/17 1124 Oral     SpO2 07/21/17 1124 98 %     Weight 07/21/17 1125 145 lb (65.8 kg)     Height 07/21/17 1125 5\' 2"  (1.575 m)     Head Circumference --      Peak Flow --      Pain Score 07/21/17 1125 2     Pain Loc --      Pain Edu? --      Excl. in GC? --    No data found.  Updated Vital Signs BP 108/75 (BP Location: Right Arm)   Pulse 96   Temp 98.4 F (36.9 C) (Oral) Comment: took ibup at 0900  Resp 16   Ht 5\' 2"  (1.575 m)   Wt 145 lb (65.8 kg)   SpO2 98%   BMI 26.52 kg/m   Visual Acuity Right Eye Distance:   Left Eye Distance:   Bilateral Distance:    Right Eye Near:   Left Eye Near:    Bilateral Near:     Physical Exam Nursing notes and Vital Signs reviewed. Appearance:  Patient appears stated age, and in no acute distress Eyes:  Pupils are equal, round, and reactive to light and accomodation.  Extraocular movement is intact.  Conjunctivae are not inflamed  Ears:  Canals normal.  Tympanic membranes normal.  Nose:  Mildly congested turbinates.  No sinus tenderness.  Pharynx:  Normal Neck:  Supple.  Enlarged posterior/lateral nodes are palpated bilaterally, tender to palpation on the left.   Lungs:  Clear to auscultation.  Breath sounds are equal.  Moving air well. Heart:  Regular rate and rhythm without murmurs, rubs, or gallops.  Abdomen:  Nontender without masses or hepatosplenomegaly.  Bowel sounds are present.  No CVA or flank tenderness.  Extremities:  No edema.  Skin:  No rash present.    UC Treatments / Results  Labs (all labs ordered are listed, but only abnormal results are displayed) Labs Reviewed - No data to display  EKG  EKG Interpretation None       Radiology No results found.  Procedures Procedures (including critical care time)  Medications Ordered in UC Medications - No data to display   Initial Impression /  Assessment and Plan / UC Course  I have reviewed the triage vital signs and the nursing notes.  Pertinent labs & imaging results that were available during my care of the patient were reviewed by me and considered in my medical decision making (see chart for details).    Begin Tamiflu. Take plain guaifenesin (1200mg  extended release tabs such as Mucinex) twice daily, with plenty of water, for  cough and congestion.  May add Pseudoephedrine (30mg , one or two every 4 to 6 hours) for sinus congestion.  Get adequate rest.   May use Afrin nasal spray (or generic oxymetazoline) each morning for about 5 days and then discontinue.  Also recommend using saline nasal spray several times daily and saline nasal irrigation (AYR is a common brand).   Try warm salt water gargles for sore throat.  Stop all antihistamines for now, and other non-prescription cough/cold preparations. May take Ibuprofen 200mg , 4 tabs every 8 hours with food for chest/sternum discomfort, body aches, headache, etc. May take Delsym Cough Suppressant at bedtime for nighttime cough.  Followup with Family Doctor if not improved in about 5 days.    Final Clinical Impressions(s) / UC Diagnoses   Final diagnoses:  Influenza-like illness    ED Discharge Orders        Ordered    oseltamivir (TAMIFLU) 75 MG capsule  Every 12 hours     07/21/17 1139           Lattie Haw, MD 07/21/17 1144

## 2017-07-21 NOTE — Discharge Instructions (Signed)
Take plain guaifenesin (1200mg  extended release tabs such as Mucinex) twice daily, with plenty of water, for cough and congestion.  May add Pseudoephedrine (30mg , one or two every 4 to 6 hours) for sinus congestion.  Get adequate rest.   May use Afrin nasal spray (or generic oxymetazoline) each morning for about 5 days and then discontinue.  Also recommend using saline nasal spray several times daily and saline nasal irrigation (AYR is a common brand).   Try warm salt water gargles for sore throat.  Stop all antihistamines for now, and other non-prescription cough/cold preparations. May take Ibuprofen 200mg , 4 tabs every 8 hours with food for chest/sternum discomfort, body aches, headache, etc. May take Delsym Cough Suppressant at bedtime for nighttime cough.

## 2018-06-27 DIAGNOSIS — R079 Chest pain, unspecified: Secondary | ICD-10-CM | POA: Diagnosis not present

## 2018-06-27 DIAGNOSIS — R9431 Abnormal electrocardiogram [ECG] [EKG]: Secondary | ICD-10-CM | POA: Diagnosis not present

## 2018-06-27 DIAGNOSIS — Z881 Allergy status to other antibiotic agents status: Secondary | ICD-10-CM | POA: Diagnosis not present

## 2018-06-27 DIAGNOSIS — Z9049 Acquired absence of other specified parts of digestive tract: Secondary | ICD-10-CM | POA: Diagnosis not present

## 2018-06-27 DIAGNOSIS — Z79899 Other long term (current) drug therapy: Secondary | ICD-10-CM | POA: Diagnosis not present

## 2018-06-27 DIAGNOSIS — Z79891 Long term (current) use of opiate analgesic: Secondary | ICD-10-CM | POA: Diagnosis not present

## 2018-06-27 DIAGNOSIS — R160 Hepatomegaly, not elsewhere classified: Secondary | ICD-10-CM | POA: Diagnosis not present

## 2018-06-27 DIAGNOSIS — R1011 Right upper quadrant pain: Secondary | ICD-10-CM | POA: Diagnosis not present

## 2018-06-27 DIAGNOSIS — K219 Gastro-esophageal reflux disease without esophagitis: Secondary | ICD-10-CM | POA: Diagnosis not present

## 2018-06-27 DIAGNOSIS — Z888 Allergy status to other drugs, medicaments and biological substances status: Secondary | ICD-10-CM | POA: Diagnosis not present

## 2018-06-27 DIAGNOSIS — Z882 Allergy status to sulfonamides status: Secondary | ICD-10-CM | POA: Diagnosis not present

## 2018-06-27 DIAGNOSIS — Z91030 Bee allergy status: Secondary | ICD-10-CM | POA: Diagnosis not present

## 2018-06-27 DIAGNOSIS — F17200 Nicotine dependence, unspecified, uncomplicated: Secondary | ICD-10-CM | POA: Diagnosis not present

## 2018-06-30 DIAGNOSIS — R109 Unspecified abdominal pain: Secondary | ICD-10-CM | POA: Diagnosis not present

## 2018-06-30 DIAGNOSIS — R112 Nausea with vomiting, unspecified: Secondary | ICD-10-CM | POA: Diagnosis not present

## 2018-06-30 DIAGNOSIS — R1013 Epigastric pain: Secondary | ICD-10-CM | POA: Diagnosis not present

## 2018-06-30 DIAGNOSIS — R935 Abnormal findings on diagnostic imaging of other abdominal regions, including retroperitoneum: Secondary | ICD-10-CM | POA: Diagnosis not present

## 2018-06-30 DIAGNOSIS — K769 Liver disease, unspecified: Secondary | ICD-10-CM | POA: Diagnosis not present

## 2018-06-30 DIAGNOSIS — R1011 Right upper quadrant pain: Secondary | ICD-10-CM | POA: Diagnosis not present

## 2018-07-01 DIAGNOSIS — R1013 Epigastric pain: Secondary | ICD-10-CM | POA: Diagnosis not present

## 2018-07-01 DIAGNOSIS — R112 Nausea with vomiting, unspecified: Secondary | ICD-10-CM | POA: Diagnosis not present

## 2018-07-01 DIAGNOSIS — R1011 Right upper quadrant pain: Secondary | ICD-10-CM | POA: Diagnosis not present

## 2018-07-01 DIAGNOSIS — K769 Liver disease, unspecified: Secondary | ICD-10-CM | POA: Diagnosis not present

## 2018-07-01 DIAGNOSIS — R109 Unspecified abdominal pain: Secondary | ICD-10-CM | POA: Diagnosis not present

## 2018-07-01 DIAGNOSIS — R935 Abnormal findings on diagnostic imaging of other abdominal regions, including retroperitoneum: Secondary | ICD-10-CM | POA: Diagnosis not present

## 2018-07-01 DIAGNOSIS — K7460 Unspecified cirrhosis of liver: Secondary | ICD-10-CM | POA: Diagnosis not present

## 2018-07-07 DIAGNOSIS — K769 Liver disease, unspecified: Secondary | ICD-10-CM | POA: Diagnosis not present

## 2018-07-07 DIAGNOSIS — R945 Abnormal results of liver function studies: Secondary | ICD-10-CM | POA: Diagnosis not present

## 2018-07-09 DIAGNOSIS — K2950 Unspecified chronic gastritis without bleeding: Secondary | ICD-10-CM | POA: Diagnosis not present

## 2018-07-09 DIAGNOSIS — R1013 Epigastric pain: Secondary | ICD-10-CM | POA: Diagnosis not present

## 2018-07-09 DIAGNOSIS — K259 Gastric ulcer, unspecified as acute or chronic, without hemorrhage or perforation: Secondary | ICD-10-CM | POA: Diagnosis not present

## 2018-07-22 DIAGNOSIS — F17210 Nicotine dependence, cigarettes, uncomplicated: Secondary | ICD-10-CM | POA: Diagnosis not present

## 2018-07-22 DIAGNOSIS — Z7951 Long term (current) use of inhaled steroids: Secondary | ICD-10-CM | POA: Diagnosis not present

## 2018-07-22 DIAGNOSIS — K7460 Unspecified cirrhosis of liver: Secondary | ICD-10-CM | POA: Diagnosis not present

## 2018-07-22 DIAGNOSIS — Z791 Long term (current) use of non-steroidal anti-inflammatories (NSAID): Secondary | ICD-10-CM | POA: Diagnosis not present

## 2018-07-22 DIAGNOSIS — K7469 Other cirrhosis of liver: Secondary | ICD-10-CM | POA: Diagnosis not present

## 2018-07-22 DIAGNOSIS — Z79899 Other long term (current) drug therapy: Secondary | ICD-10-CM | POA: Diagnosis not present

## 2018-07-22 DIAGNOSIS — K760 Fatty (change of) liver, not elsewhere classified: Secondary | ICD-10-CM | POA: Diagnosis not present

## 2018-11-22 IMAGING — DX DG CHEST 2V
2 series · 2 of 2 positions shown · non-contrast
Comparison: None.

CLINICAL DATA: Cough and congestion for several days

EXAM:
CHEST  2 VIEW

[chest pa]
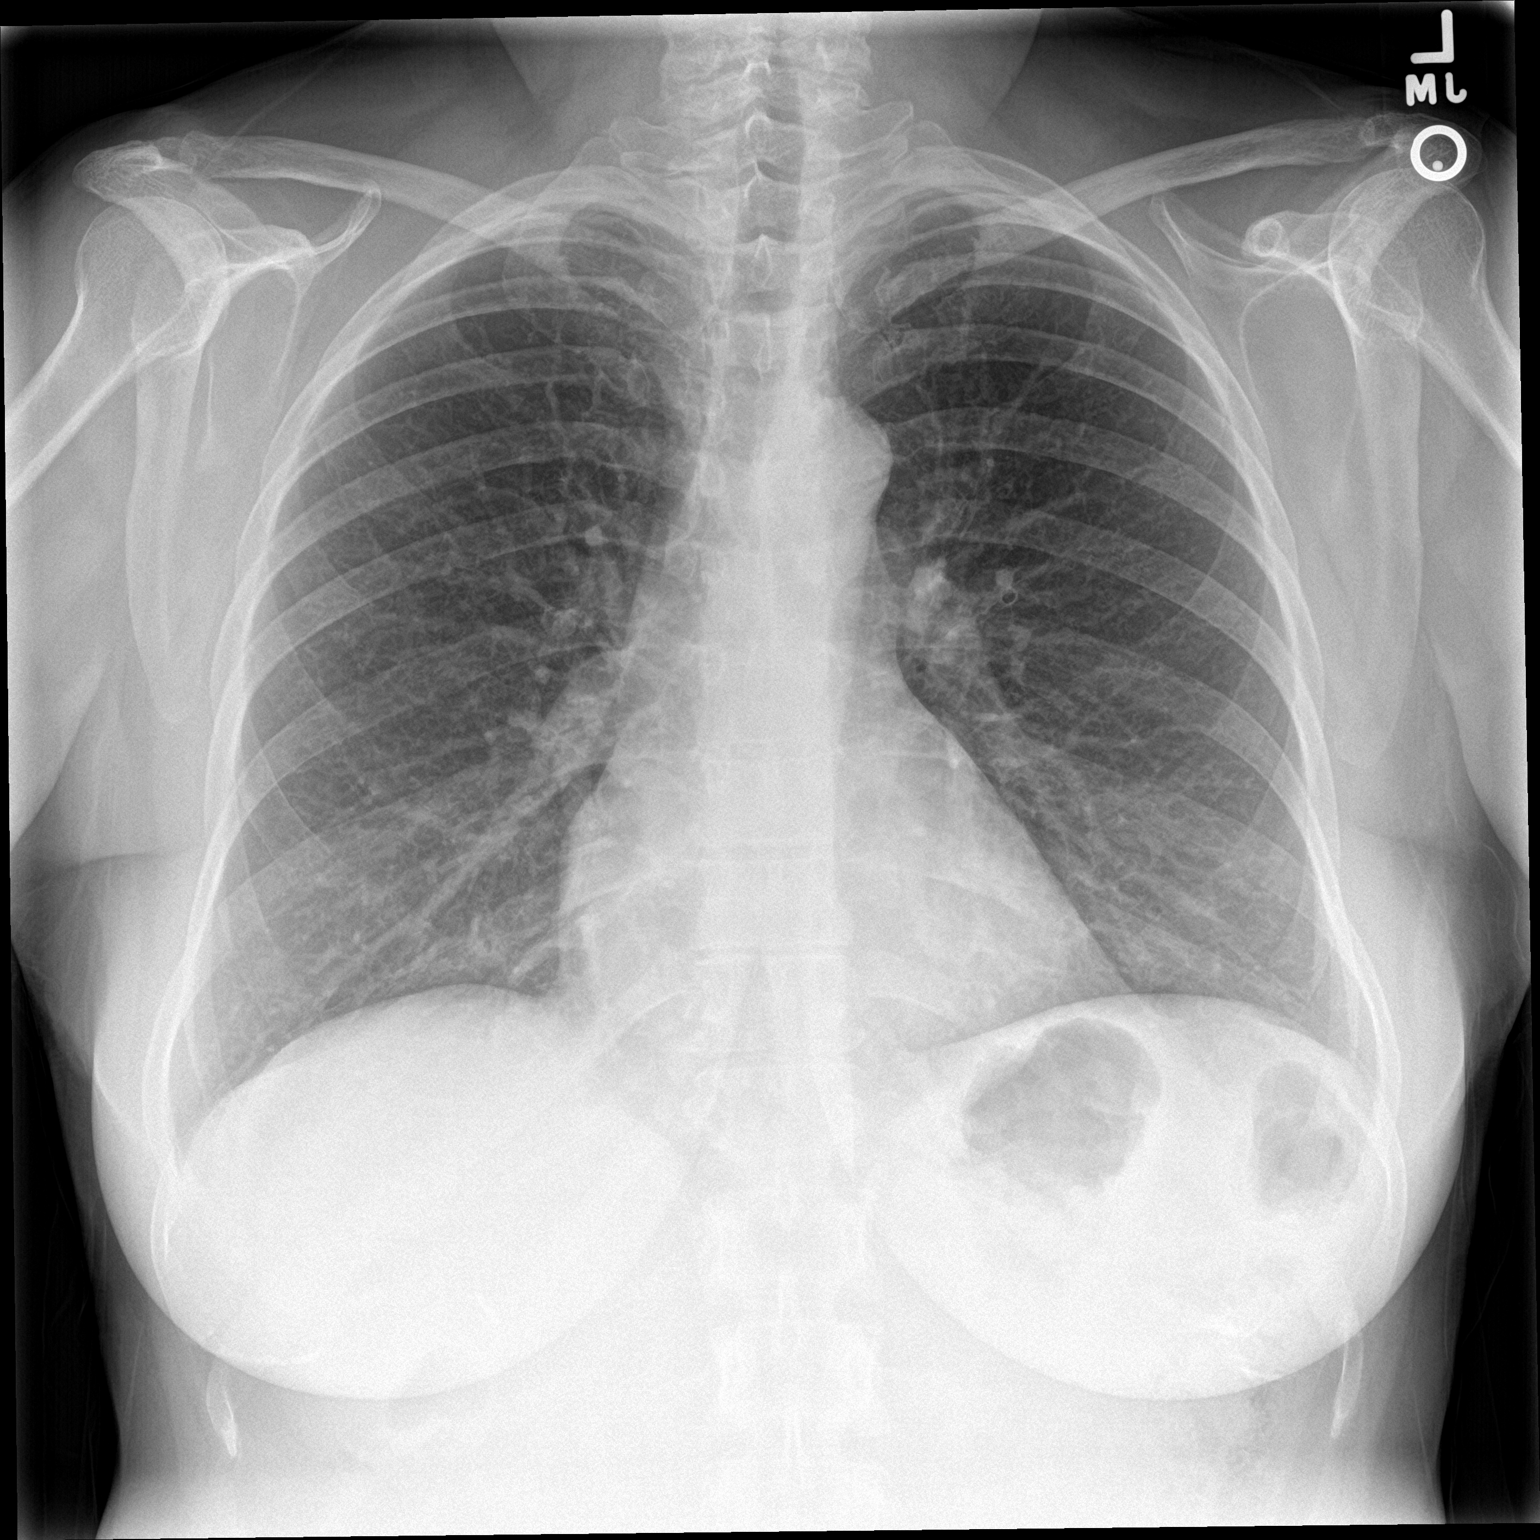

[chest lat]
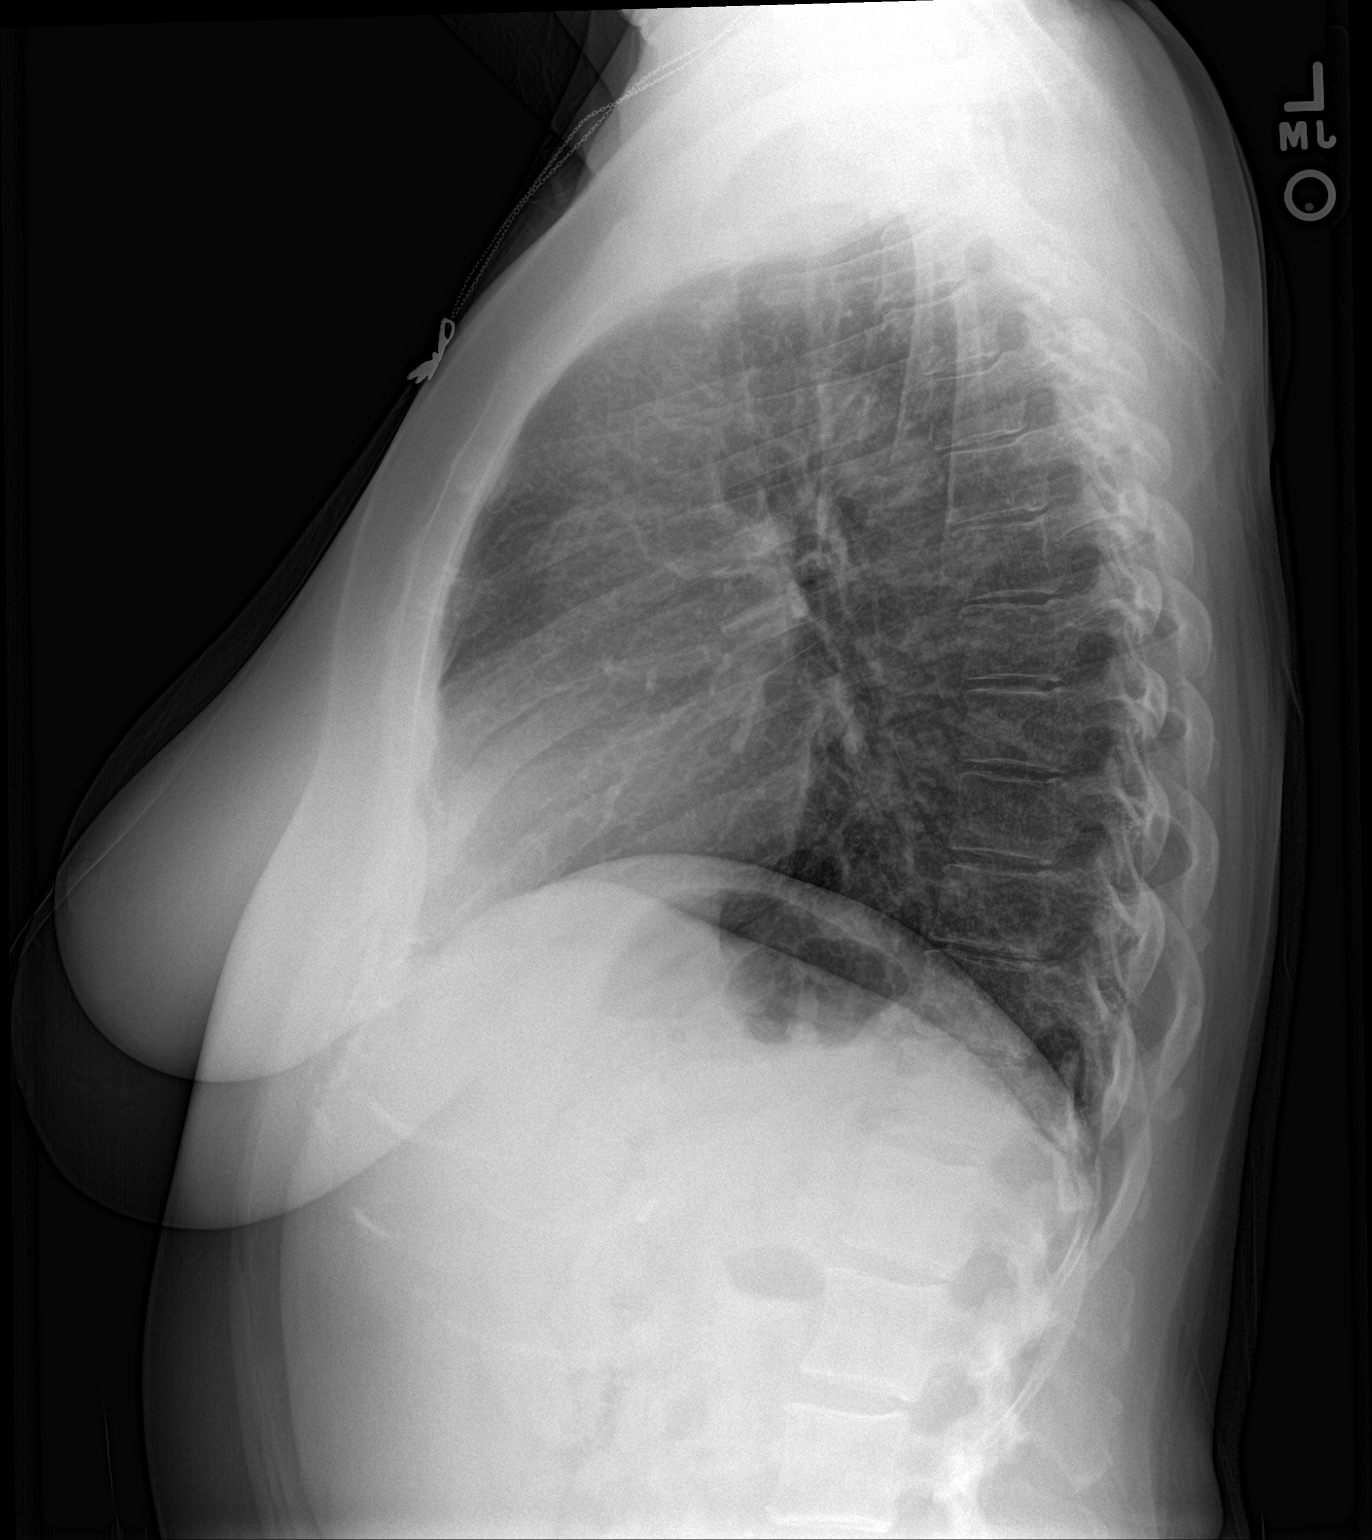

[2 of 2 positions shown; findings below may reference images not displayed]

FINDINGS: The heart size and mediastinal contours are within normal limits.
Both lungs are clear. The visualized skeletal structures are
unremarkable.
IMPRESSION: No active cardiopulmonary disease.
